# Patient Record
Sex: Male | Born: 1990 | Race: White | Hispanic: No | Marital: Married | Smoking: Never smoker
Health system: Southern US, Community
[De-identification: ages and names within clinical notes are randomized; demographics above are authoritative.]

## PROBLEM LIST (undated history)

## (undated) DIAGNOSIS — I309 Acute pericarditis, unspecified: Secondary | ICD-10-CM

## (undated) DIAGNOSIS — I38 Endocarditis, valve unspecified: Secondary | ICD-10-CM

## (undated) DIAGNOSIS — T7840XA Allergy, unspecified, initial encounter: Secondary | ICD-10-CM

## (undated) DIAGNOSIS — G039 Meningitis, unspecified: Secondary | ICD-10-CM

## (undated) HISTORY — DX: Acute pericarditis, unspecified: I30.9

## (undated) HISTORY — PX: TONSILLECTOMY: SUR1361

## (undated) HISTORY — PX: COLONOSCOPY: SHX174

## (undated) HISTORY — DX: Allergy, unspecified, initial encounter: T78.40XA

---
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
## 2015-05-13 ENCOUNTER — Emergency Department (HOSPITAL_COMMUNITY): Payer: Self-pay

## 2015-05-13 ENCOUNTER — Emergency Department (HOSPITAL_COMMUNITY)
Admission: EM | Admit: 2015-05-13 | Discharge: 2015-05-13 | Disposition: A | Discharge status: Home / Self Care | Payer: Self-pay | Attending: Emergency Medicine | Admitting: Emergency Medicine

## 2015-05-13 ENCOUNTER — Encounter (HOSPITAL_COMMUNITY): Payer: Self-pay | Admitting: Emergency Medicine

## 2015-05-13 DIAGNOSIS — Z8669 Personal history of other diseases of the nervous system and sense organs: Secondary | ICD-10-CM | POA: Insufficient documentation

## 2015-05-13 DIAGNOSIS — R0789 Other chest pain: Secondary | ICD-10-CM | POA: Insufficient documentation

## 2015-05-13 DIAGNOSIS — R079 Chest pain, unspecified: Secondary | ICD-10-CM

## 2015-05-13 HISTORY — DX: Meningitis, unspecified: G03.9

## 2015-05-13 MED ORDER — IBUPROFEN 800 MG PO TABS: 800.0000 mg | ORAL_TABLET | Freq: Three times a day (TID) | ORAL | Status: DC | PRN

## 2015-05-13 MED ORDER — IBUPROFEN 800 MG PO TABS
800.0000 mg | ORAL_TABLET | Freq: Once | ORAL | Status: AC
  Administered 2015-05-13: 800 mg via ORAL
  Filled 2015-05-13: qty 1

## 2015-05-13 NOTE — Discharge Instructions (Signed)

## 2015-05-13 NOTE — ED Provider Notes (Signed)
TIME SEEN: 5:00 AM  CHIEF COMPLAINT: Chest pain  HPI: Pt is a 24 y.o. male with no significant past medical history who presents to the emergency department with left-sided chest pain that he describes as sharp, moderate in nature without aggravating and relieving factors with tingling and his left arm that started just prior to arrival while at work. States he has had intermittent similar symptoms since he was 24 years old. States he has seen a cardiologist before and has worn a Holter monitor and have an echocardiogram. States he was told at one point that he had "inflammation around his heart" without any cause. He denies any shortness of breath. States he did have some dizziness. No nausea, vomiting or diarrhea. No fever or cough. Pain is not exertional or pleuritic. Not associated with food. No lower extremity swelling or pain. Denies any history of PE or DVT, recent prolonged immobilization such as long flight or hospitalization, fracture, surgery, trauma. Does have a family history of a grandfather who had coronary artery disease but no one with premature CAD. No one with sudden cardiac death, HOCM.  Patient denies syncope.  ROS: See HPI Constitutional: no fever  Eyes: no drainage  ENT: no runny nose   Cardiovascular:   chest pain  Resp: no SOB  GI: no vomiting GU: no dysuria Integumentary: no rash  Allergy: no hives  Musculoskeletal: no leg swelling  Neurological: no slurred speech ROS otherwise negative  PAST MEDICAL HISTORY/PAST SURGICAL HISTORY:  Past Medical History  Diagnosis Date  . Meningitis     MEDICATIONS:  Prior to Admission medications   Not on File    ALLERGIES:  Allergies  Allergen Reactions  . Keflex [Cephalexin]     SOCIAL HISTORY:  History  Substance Use Topics  . Smoking status: Never Smoker   . Smokeless tobacco: Not on file  . Alcohol Use: No    FAMILY HISTORY: History reviewed. No pertinent family history.  EXAM: BP 124/89 mmHg  Pulse 101   Temp(Src) 98.2 F (36.8 C)  Resp 18  Ht 5\' 11"  (1.803 m)  Wt 150 lb (68.04 kg)  BMI 20.93 kg/m2  SpO2 98% CONSTITUTIONAL: Alert and oriented and responds appropriately to questions. Well-appearing; well-nourished HEAD: Normocephalic EYES: Conjunctivae clear, PERRL ENT: normal nose; no rhinorrhea; moist mucous membranes; pharynx without lesions noted NECK: Supple, no meningismus, no LAD  CARD: RRR; S1 and S2 appreciated; no murmurs, no clicks, no rubs, no gallops, chest wall nontender to palpation without crepitus or ecchymosis or deformity RESP: Normal chest excursion without splinting or tachypnea; breath sounds clear and equal bilaterally; no wheezes, no rhonchi, no rales, no hypoxia or respiratory distress, speaking full sentences ABD/GI: Normal bowel sounds; non-distended; soft, non-tender, no rebound, no guarding, no peritoneal signs BACK:  The back appears normal and is non-tender to palpation, there is no CVA tenderness EXT: Normal ROM in all joints; non-tender to palpation; no edema; normal capillary refill; no cyanosis, no calf tenderness or swelling    SKIN: Normal color for age and race; warm NEURO: Moves all extremities equally, sensation to light touch intact diffusely, cranial nerves II through XII intact, strength 5/5 in all 4 extremity, normal gait PSYCH: The patient's mood and manner are appropriate. Grooming and personal hygiene are appropriate.  MEDICAL DECISION MAKING: Patient here with atypical chest pain. Heart rate was initially 101 but on my evaluation it is in the 80s. His EKG shows RSR' with no other ischemic changes, arrhythmia or interval change. No old  for comparison. He has no risk factors for ACS. He is PERC negative.  Chest x-ray shows no acute abnormality. We'll discharge patient home with primary care provider follow-up information as well as cardiology follow up information per his request. We'll discharge with anti-inflammatories for pain. I do not feel he  needs any further emergent workup at this time. Discussed return precautions. He verbalizes understanding is comfortable with plan.     EKG Interpretation  Date/Time:  Tuesday May 13 2015 04:55:26 EDT Ventricular Rate:  82 PR Interval:  136 QRS Duration: 99 QT Interval:  348 QTC Calculation: 406 R Axis:   78 Text Interpretation:  Sinus rhythm RSR' in V1 or V2, right VCD or RVH No old tracing to compare Confirmed by Jimmi Sidener,  DO, Odelle Kosier [phone]) on 05/13/2015 4:57:41 AM        Layla Maw Jacolby Risby, DO 05/13/15 6045

## 2015-05-13 NOTE — ED Notes (Addendum)
   05/13/15 0459  Chest Pain Assessment  Occurrence Today  Chronicity New  Chest Pain Location Left chest  Pain Descriptors / Indicators Sharp  Associated Symptoms Dizziness  chest pain that started at work. Pain described as sharp. Pt says had have these pains before has seen cardiology, wore holitor monitor & ultra sound done . Was told had inflammation around his heart. Pt states also having left arm numbness.

## 2015-05-13 NOTE — ED Notes (Signed)
Pt alert & oriented x4, stable gait. Patient given discharge instructions, paperwork & prescription(s). Patient  instructed to stop at the registration desk to finish any additional paperwork. Patient verbalized understanding. Pt left department w/ no further questions. 

## 2015-05-13 NOTE — ED Notes (Signed)
Pt c/o sharp left chest pain and left arm numbness while at work tonight.

## 2016-08-24 ENCOUNTER — Encounter (HOSPITAL_COMMUNITY): Payer: Self-pay | Admitting: *Deleted

## 2016-08-24 ENCOUNTER — Emergency Department (HOSPITAL_COMMUNITY): Payer: Self-pay

## 2016-08-24 ENCOUNTER — Emergency Department (HOSPITAL_COMMUNITY)
Admission: EM | Admit: 2016-08-24 | Discharge: 2016-08-24 | Disposition: A | Discharge status: Home / Self Care | Payer: Self-pay | Attending: Emergency Medicine | Admitting: Emergency Medicine

## 2016-08-24 DIAGNOSIS — Z791 Long term (current) use of non-steroidal anti-inflammatories (NSAID): Secondary | ICD-10-CM | POA: Insufficient documentation

## 2016-08-24 DIAGNOSIS — R0789 Other chest pain: Secondary | ICD-10-CM | POA: Insufficient documentation

## 2016-08-24 HISTORY — DX: Endocarditis, valve unspecified: I38

## 2016-08-24 LAB — BASIC METABOLIC PANEL
Anion gap: 4 — ABNORMAL LOW (ref 5–15)
BUN: 12 mg/dL (ref 6–20)
CALCIUM: 9.3 mg/dL (ref 8.9–10.3)
CHLORIDE: 107 mmol/L (ref 101–111)
CO2: 27 mmol/L (ref 22–32)
Creatinine, Ser: 0.74 mg/dL (ref 0.61–1.24)
GFR calc non Af Amer: >60 mL/min (ref >60)
Glucose, Bld: 97 mg/dL (ref 65–99)
Potassium: 4.4 mmol/L (ref 3.5–5.1)
SODIUM: 138 mmol/L (ref 135–145)

## 2016-08-24 LAB — CBC
HCT: 41.4 % (ref 39.0–52.0)
Hemoglobin: 14.7 g/dL (ref 13.0–17.0)
MCH: 31.5 pg (ref 26.0–34.0)
MCHC: 35.5 g/dL (ref 30.0–36.0)
MCV: 88.7 fL (ref 78.0–100.0)
PLATELETS: 292 10*3/uL (ref 150–400)
RBC: 4.67 MIL/uL (ref 4.22–5.81)
RDW: 12.9 % (ref 11.5–15.5)
WBC: 8.6 10*3/uL (ref 4.0–10.5)

## 2016-08-24 LAB — I-STAT TROPONIN, ED: TROPONIN I, POC: 0 ng/mL (ref 0.00–0.08)

## 2016-08-24 MED ORDER — CYCLOBENZAPRINE HCL 10 MG PO TABS: 10.0000 mg | ORAL_TABLET | Freq: Three times a day (TID) | ORAL | 0 refills | Status: DC

## 2016-08-24 MED ORDER — DEXAMETHASONE 4 MG PO TABS: 4.0000 mg | ORAL_TABLET | Freq: Two times a day (BID) | ORAL | 0 refills | Status: DC

## 2016-08-24 MED ORDER — DIAZEPAM 5 MG PO TABS
10.0000 mg | ORAL_TABLET | Freq: Once | ORAL | Status: AC
  Administered 2016-08-24: 10 mg via ORAL
  Filled 2016-08-24: qty 2

## 2016-08-24 MED ORDER — DICLOFENAC SODIUM 75 MG PO TBEC: 75.0000 mg | DELAYED_RELEASE_TABLET | Freq: Two times a day (BID) | ORAL | 0 refills | Status: DC

## 2016-08-24 MED ORDER — KETOROLAC TROMETHAMINE 30 MG/ML IJ SOLN
30.0000 mg | Freq: Once | INTRAMUSCULAR | Status: AC
  Administered 2016-08-24: 30 mg via INTRAVENOUS
  Filled 2016-08-24: qty 1

## 2016-08-24 NOTE — ED Triage Notes (Addendum)
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 Pt was given 1 Nitro with some BP decrease by EMS, no relief with 1st Nitro and due to BP decrease no further Nitro was given by EMS. Pt has had similar chest pain experiences since age 25 with no reason as to why. Pt has been told to follow up with cardiology several times in the past but is unable to due to not having insurance. Pt denies SOB,  dizziness, lightheadedness, n/v, back pain, arm pain. Pt reports the pain gets worse with deep breaths and any type of movement. When pt was 25 years old he saw a cardiologist and was told he had unknown reason for inflammation around his heart.

## 2016-08-24 NOTE — ED Provider Notes (Signed)
[facility]EMER[facility]ENCY DEPT Provider Note   CSN: [id number] Arrival date & time: 08/24/16  [id number]     History   Chief Complaint Chief Complaint  Patient presents with  . Chest Pain    HPI Sharlyn BolognaJustin E Taitano is a 25 y.o. male.  Patient is a 25 year old male who presents to the emergency department with complaint of chest pain.  The patient states that he was having some sharp pain on last evening. 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 He did not have any loss of consciousness. He did not have any unusual shortness of breath, just the pain. There was no radiation of the pain to the neck or the jaw or down the arm. Patient states he had a similar episode at age 25 and was told he had chest wall pain with inflammation. He says that this pain feels very similar to that. He states that today's pain has been lasting about 2 and half hours. It seems to come and go, but is worse when he changes positions or takes a deep breath. There's been no injury or trauma reported. No surgery to the chest. No excessive cough or fever. No chills, no sweats, no wheezing. The patient denies smoking, alcohol, recreational drugs, and has not been exposed to any new perfumes or chemicals. He's not bone been on any excessively long trips or been immobile on. He has not traveled outside the country. He presents now for assistance with this issue.   The history is provided by the patient.  Chest Pain   Pertinent negatives include no cough, no nausea and no vomiting.    Past Medical History:  Diagnosis Date  . 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 Meningitis     There are no active problems to display for this patient.   Past Surgical History:  Procedure Laterality Date  . TONSILLECTOMY         Home Medications    Prior to Admission medications   Medication Sig Start Date End Date Taking? Authorizing Provider  ibuprofen  (ADVIL,MOTRIN) 200 MG tablet Take 400 mg by mouth every 6 (six) hours as needed.   Yes Historical Provider, MD  ibuprofen (ADVIL,MOTRIN) 800 MG tablet Take 1 tablet (800 mg total) by mouth every 8 (eight) hours as needed for mild pain. Patient not taking: Reported on 08/24/2016 05/13/15   Layla MawKristen N Ward, DO    Family History No family history on file.  Social History Social History  Substance Use Topics  . Smoking status: Never Smoker  . Smokeless tobacco: Never Used  . Alcohol use No     Allergies   Keflex [cephalexin]   Review of Systems Review of Systems  Constitutional: Positive for activity change.  HENT: Negative for congestion.   Respiratory: Negative for cough and wheezing.   Cardiovascular: Positive for chest pain.  Gastrointestinal: Negative for diarrhea, nausea and vomiting.  Musculoskeletal: Negative for neck pain.  Psychiatric/Behavioral: The patient is nervous/anxious.   All other systems reviewed and are negative.    Physical Exam Updated Vital Signs BP 111/85   Pulse 78   Temp 98.1 F (36.7 C) (Oral)   Resp 15   Ht 5\' 11"  (1.803 m)   Wt 65.8 kg   SpO2 97%   BMI 20.22 kg/m   Physical Exam  Constitutional: He is oriented to person,  place, and time. He appears well-developed and well-nourished.  Non-toxic appearance.  HENT:  Head: Normocephalic.  Right Ear: Tympanic membrane and external ear normal.  Left Ear: Tympanic membrane and external ear normal.  Eyes: EOM and lids are normal. Pupils are equal, round, and reactive to light.  Neck: Normal range of motion. Neck supple. Carotid bruit is not present.  Cardiovascular: Normal rate, regular rhythm, normal heart sounds, intact distal pulses and normal pulses.   Pulmonary/Chest: Breath sounds normal. No respiratory distress. He exhibits tenderness. He exhibits no deformity and no retraction.    Abdominal: Soft. Bowel sounds are normal. There is no tenderness. There is no guarding.  Musculoskeletal:  Normal range of motion.  Lymphadenopathy:       Head (right side): No submandibular adenopathy present.       Head (left side): No submandibular adenopathy present.    He has no cervical adenopathy.  Neurological: He is alert and oriented to person, place, and time. He has normal strength. No cranial nerve deficit or sensory deficit.  Skin: Skin is warm and dry.  Psychiatric: He has a normal mood and affect. His speech is normal.  Nursing note and vitals reviewed.    ED Treatments / Results  Labs (all labs ordered are listed, but only abnormal results are displayed) Labs Reviewed  BASIC METABOLIC PANEL - Abnormal; Notable for the following:       Result Value   Anion gap 4 (*)    All other components within normal limits  CBC  I-STAT TROPOININ, ED    EKG  EKG Interpretation None       Radiology Dg Chest 2 View  Result Date: 08/24/2016 CLINICAL DATA:  Left side chest pain EXAM: CHEST  2 VIEW COMPARISON:  07/25/2016 FINDINGS: Cardiomediastinal silhouette is stable. Mild elevation of the left hemidiaphragm again noted. No infiltrate or pulmonary edema. Bony thorax is unremarkable. IMPRESSION: No active cardiopulmonary disease. Electronically Signed   By: Natasha Mead M.D.   On: 08/24/2016 11:31    Procedures Procedures (including critical care time)  Medications Ordered in ED Medications  ketorolac (TORADOL) 30 MG/ML injection 30 mg (30 mg Intravenous Given 08/24/16 1122)  diazepam (VALIUM) tablet 10 mg (10 mg Oral Given 08/24/16 1122)     Initial Impression / Assessment and Plan / ED Course  I have reviewed the triage vital signs and the nursing notes.  Pertinent labs & imaging results that were available during my care of the patient were reviewed by me and considered in my medical decision making (see chart for details).  Clinical Course     *I have reviewed nursing notes, vital signs, and all appropriate lab and imaging results for this patient.**  Final Clinical  Impressions(s) / ED Diagnoses  Vital signs within normal limits. Pulse oximetry is 97% on room air. Electrocardiogram is negative for acute event. Troponin is 0 . basic metabolic panel is negative for acute issue. The complete blood count is well within normal limits. The chest x-ray is also read as negative. PERC neg. The patient has reproduction of the pain with sitting up and with taking a deep breath.  The pain is much improved after intravenous Toradol and oral Valium.  Patient will be treated with diclofenac and Flexeril and Decadron. Patient is to follow with his primary physician or return to the emergency department if any changes or problems.    Final diagnoses:  None    New Prescriptions New Prescriptions   No medications on  file     Ivery QualeHobson Brieanne Mignone, PA-C 08/24/16 1220    Ivery QualeHobson Bettey Muraoka, PA-C 08/24/16 1224    Bethann BerkshireJoseph Zammit, MD 08/24/16 [id number]

## 2016-08-24 NOTE — Discharge Instructions (Signed)
Your heart enzymes and EKG are negative. Your chest x-ray is negative. Your electrolytes are also negative on. Please soak your chest in a tub of warm Epsom salt water for about 15-20 minutes until this pain resolves. Please use Decadron and diclofenac 2 times daily with food. Use Flexeril in the evening and at bedtime.This medication may cause drowsiness. Please do not drink, drive, or participate in activity that requires concentration while taking this medication.

## 2016-09-29 ENCOUNTER — Ambulatory Visit (INDEPENDENT_AMBULATORY_CARE_PROVIDER_SITE_OTHER): Payer: PRIVATE HEALTH INSURANCE | Admitting: Cardiovascular Disease

## 2016-09-29 ENCOUNTER — Encounter: Payer: Self-pay | Admitting: Cardiovascular Disease

## 2016-09-29 VITALS — BP 110/84 | HR 84 | Ht 71.0 in | Wt 184.0 lb

## 2016-09-29 DIAGNOSIS — R079 Chest pain, unspecified: Secondary | ICD-10-CM

## 2016-09-29 DIAGNOSIS — I309 Acute pericarditis, unspecified: Secondary | ICD-10-CM

## 2016-09-29 MED ORDER — COLCHICINE 0.6 MG PO TABS: 0.6000 mg | ORAL_TABLET | Freq: Two times a day (BID) | ORAL | 6 refills | Status: DC

## 2016-09-29 NOTE — Progress Notes (Signed)
CARDIOLOGY CONSULT NOTE  Patient ID: Jeffrey Ortega MRN: [id number] DOB/AGE: 25/03/1991 25 y.o.  Admit date: (Not on file) Primary Physician: No PCP Per Patient Referring Physician: self referral  Reason for Consultation: chest pain  HPI: The patient is a 25 year old male who presents for the evaluation of chronic chest pain. He began experiencing chest pain at the age of [age] and says "no one can tell when it is ". He underwent an echocardiogram at age 25 and said he was told he had "inflammation". He also wore a Holter monitor and nothing was found. He was told to take 9 ibuprofen to reduce inflammation but he still had chest pain. There are no specific aggravating or alleviating factors.  He was seen in the ED on 08/24/16 for chest pain and had a normal ECG, chest x-ray, troponin, and CBC. It can occur when he is playing board games, driving, or sitting down.   It is located in the left precordium and described as sharp and squeezing. He denies chest wall tenderness. He denies any dietary factors which make it worse. The pain can last up to 3 hours at a time. When he was seen in the ED most recently, it was aggravated with deep breathing. He denies tick, spider, and snake bites.  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    Allergies  Allergen Reactions  . Keflex [Cephalexin]     No current outpatient prescriptions on file.   No current facility-administered medications for this visit.     Past Medical History:  Diagnosis Date  . 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 Meningitis     Past Surgical History:  Procedure Laterality Date  . TONSILLECTOMY      Social History   Social History  . Marital status: Single    Spouse name: N/A  . Number of children: N/A  . Years of education: N/A   Occupational History  . Not on file.   Social History Main Topics  . Smoking status: Never Smoker  .  Smokeless tobacco: Never Used  . Alcohol use No  . Drug use: No  . Sexual activity: Not on file   Other Topics Concern  . Not on file   Social History Narrative  . No narrative on file     No family history of premature CAD in 1st degree relatives.  Prior to Admission medications   Not on File     Review of systems complete and found to be negative unless listed above in HPI     Physical exam Blood pressure 110/84, pulse 84, height 5\' 11"  (1.803 m), weight 184 lb (83.5 kg), SpO2 98 %. General: NAD Neck: No JVD, no thyromegaly or thyroid nodule.  Lungs: Clear to auscultation bilaterally with normal respiratory effort. CV: Nondisplaced PMI. Regular rate and rhythm, normal S1/S2, no S3/S4, no murmur.  No peripheral edema.  No carotid bruit.    Abdomen: Soft, nontender, no hepatosplenomegaly, no distention.  Skin: Intact without lesions or rashes.  Neurologic: Alert and oriented x 3.  Psych: Normal affect. Extremities: No clubbing or cyanosis.  HEENT: Normal.   ECG: Most recent ECG reviewed.  Labs:   Lab Results  Component Value Date   WBC 8.6 08/24/2016   HGB 14.7 08/24/2016   HCT 41.4 08/24/2016   MCV 88.7 08/24/2016   PLT 292 08/24/2016  No results for input(s): NA, K, CL, CO2, BUN, CREATININE, CALCIUM, PROT, BILITOT, ALKPHOS, ALT, AST, GLUCOSE in the last 168 hours.  Invalid input(s): LABALBU No results found for: CKTOTAL, CKMB, CKMBINDEX, TROPONINI No results found for: CHOL No results found for: HDL No results found for: LDLCALC No results found for: TRIG No results found for: CHOLHDL No results found for: LDLDIRECT       Studies: No results found.  ASSESSMENT AND PLAN:  1. Chest pain: Symptoms are highly suspicious for pericarditis. I will prescribe colchicine 0.6 mg twice daily for 6 months. I will also obtain a cardiac MRI to evaluate cardiac structure and the pericardium in particular.  Dispo: fu 3 months   Signed: Prentice DockerSuresh Koneswaran,  M.D., F.A.C.C.  09/29/2016, 9:20 AM

## 2016-09-29 NOTE — Patient Instructions (Signed)
Medication Instructions:   Begin Colcrys (Colchicine) 0.6mg  twice a day x 6 months.  Continue all other medications.    Labwork: none  Testing/Procedures:  Your physician has requested that you have a cardiac MRI. Cardiac MRI uses a computer to create images of your heart as its beating, producing both still and moving pictures of your heart and major blood vessels. For further information please visit [email or web address]. Please follow the instruction sheet given to you today for more information.  Office will contact with results via phone or letter.    Follow-Up: 3 months   Any Other Special Instructions Will Be Listed Below (If Applicable).  If you need a refill on your cardiac medications before your next appointment, please call your pharmacy.

## 2016-10-05 ENCOUNTER — Telehealth: Payer: Self-pay | Admitting: Cardiovascular Disease

## 2016-10-05 NOTE — Telephone Encounter (Signed)
Called patient and left a voicemail for him to call me back.  We have his MRI scheduled for 10-08-16 aat 8:30 a.m.

## 2016-10-07 ENCOUNTER — Other Ambulatory Visit: Payer: Self-pay | Admitting: *Deleted

## 2016-10-07 DIAGNOSIS — Z01812 Encounter for preprocedural laboratory examination: Secondary | ICD-10-CM

## 2016-10-07 DIAGNOSIS — R079 Chest pain, unspecified: Secondary | ICD-10-CM

## 2016-10-08 ENCOUNTER — Ambulatory Visit (HOSPITAL_COMMUNITY)
Admission: RE | Admit: 2016-10-08 | Discharge: 2016-10-08 | Disposition: A | Discharge status: Home / Self Care | Payer: 59 | Source: Ambulatory Visit | Attending: Cardiovascular Disease | Admitting: Cardiovascular Disease

## 2016-10-08 DIAGNOSIS — I340 Nonrheumatic mitral (valve) insufficiency: Secondary | ICD-10-CM | POA: Diagnosis not present

## 2016-10-08 DIAGNOSIS — R079 Chest pain, unspecified: Secondary | ICD-10-CM

## 2016-10-08 DIAGNOSIS — I309 Acute pericarditis, unspecified: Secondary | ICD-10-CM

## 2016-10-08 LAB — CREATININE, SERUM
CREATININE: 0.92 mg/dL (ref 0.61–1.24)
GFR calc Af Amer: >60 mL/min (ref >60)
GFR calc non Af Amer: >60 mL/min (ref >60)

## 2016-10-08 MED ORDER — GADOBENATE DIMEGLUMINE 529 MG/ML IV SOLN: 27.0000 mL | Freq: Once | INTRAVENOUS | Status: DC | PRN

## 2016-10-12 ENCOUNTER — Telehealth: Payer: Self-pay | Admitting: Cardiology

## 2016-10-12 NOTE — Telephone Encounter (Signed)
Mr. Jeffrey Ortega called requesting test results of his recent MRI.  States that he is reading results on My Chart and he thinks it is serious.

## 2016-10-12 NOTE — Telephone Encounter (Signed)
Notes Recorded by Lesle ChrisAngela G Benford Asch, LPN on 40/98/119112/26/2017 at 5:58 PM EST Patient notified.  No PMD listed.   Notes Recorded by Antoine PocheJonathan F Branch, MD on 10/12/2016 at 3:23 PM EST Heart MR did show some evidence of pericarditis, the medicine Dr Kirtland BouchardK started him on should help over time. How are his symptoms doing?

## 2016-10-12 NOTE — Telephone Encounter (Signed)
Patient also had multiple questions about the results & question if he could see the MD sooner than 3 months.  Also stated that he has not been able to get the Colchicine due to cost.    Reviewed test results with him, but was concerned about the mitral regurgitation as he read this in My Chart.   His insurance [employer]) did not cover the generic Colchicine, but did prefer the brand (Colcrys).  Even with his insurance, co-pay per month was going to be $329.68.    Is there any other medication options.  Not sure if any assistance is available for this particular medication.

## 2016-10-13 NOTE — Telephone Encounter (Signed)
After further research, there is a co-pay assistance card that patient may be eligible to use.  Patient can download a savings card at [email or web address].  Left message on patient's voicemail & mailed this info.

## 2016-10-13 NOTE — Telephone Encounter (Signed)
Patient notified

## 2016-10-13 NOTE — Telephone Encounter (Signed)
Test showed only mild mitral regurgitation, this is a very common finding and not something I would be worried about. If unable to afford colchicine does not have to start. If he is still having chest pain I would start ibuprofen 800mg  tid for 2 weeks and then call us back to update us on symptoms, he can f/u with Dr Kirtland BouchardK in 1 month. Needs to stay very well hydrated with taking ibuprofen. If no current chest pain he does not actually require medications. Pericarditis is most commonly causes by viruses that self resolve, they can leave some lingering inflammation which is primarily what the medications treat.     Dominga FerryJ Ezana Hubbert MD

## 2016-10-15 ENCOUNTER — Other Ambulatory Visit (HOSPITAL_COMMUNITY): Payer: PRIVATE HEALTH INSURANCE

## 2016-11-08 ENCOUNTER — Ambulatory Visit: Payer: Self-pay | Admitting: Cardiovascular Disease

## 2016-11-09 ENCOUNTER — Encounter: Payer: Self-pay | Admitting: Cardiovascular Disease

## 2017-01-05 ENCOUNTER — Ambulatory Visit: Payer: Self-pay | Admitting: Cardiovascular Disease

## 2017-01-12 ENCOUNTER — Telehealth: Payer: Self-pay | Admitting: Cardiovascular Disease

## 2017-01-12 NOTE — Telephone Encounter (Signed)
PATIENT DOES NOT WANT US TO CALL HIM WITH REMINDER DAY BEFORE [facility]

## 2017-01-13 ENCOUNTER — Ambulatory Visit (INDEPENDENT_AMBULATORY_CARE_PROVIDER_SITE_OTHER): Payer: PRIVATE HEALTH INSURANCE | Admitting: Cardiovascular Disease

## 2017-01-13 ENCOUNTER — Encounter: Payer: Self-pay | Admitting: Cardiovascular Disease

## 2017-01-13 VITALS — BP 118/80 | HR 91 | Ht 71.0 in | Wt 199.0 lb

## 2017-01-13 DIAGNOSIS — R079 Chest pain, unspecified: Secondary | ICD-10-CM | POA: Diagnosis not present

## 2017-01-13 DIAGNOSIS — I309 Acute pericarditis, unspecified: Secondary | ICD-10-CM | POA: Diagnosis not present

## 2017-01-13 MED ORDER — PREDNISONE 5 MG PO TABS: ORAL_TABLET | ORAL | 3 refills | Status: DC

## 2017-01-13 NOTE — Progress Notes (Signed)
      SUBJECTIVE: The patient presents for follow-up of pericarditis. Cardiac MRI on 10/09/16 demonstrated normal left ventricular systolic function, LVEF 60%, mild mitral regurgitation, and mild localized thickening anteriorly of the pericardium associated mild inflammation and consistent with a mild focal acute pericarditis. There was no pericardial effusion.  He continues to experience chest pain sporadically. He may experience it every day for a week and then he may go one month without any chest pain. He has tried taking ibuprofen up to 400 mg 3-4 times daily with no relief. Colchicine would cost him over $400.   Review of Systems: As per "subjective", otherwise negative.  Allergies  Allergen Reactions  . Keflex [Cephalexin]     No current outpatient prescriptions on file.   No current facility-administered medications for this visit.     Past Medical History:  Diagnosis Date  . 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 Meningitis     Past Surgical History:  Procedure Laterality Date  . TONSILLECTOMY      Social History   Social History  . Marital status: Single    Spouse name: N/A  . Number of children: N/A  . Years of education: N/A   Occupational History  . Not on file.   Social History Main Topics  . Smoking status: Never Smoker  . Smokeless tobacco: Never Used  . Alcohol use No  . Drug use: No  . Sexual activity: Not on file   Other Topics Concern  . Not on file   Social History Narrative  . No narrative on file     Vitals:   01/13/17 1523  BP: 118/80  Pulse: 91  SpO2: 96%  Weight: 199 lb (90.3 kg)  Height: 5\' 11"  (1.803 m)    PHYSICAL EXAM General: NAD HEENT: Normal. Neck: No JVD, no thyromegaly. Lungs: Clear to auscultation bilaterally with normal respiratory effort. CV: Nondisplaced PMI.  Regular rate and rhythm, normal S1/S2, no S3/S4, no murmur or rubs. No pretibial or periankle edema.  No carotid bruit.   Abdomen:  Soft, nontender, no distention.  Neurologic: Alert and oriented.  Psych: Normal affect. Skin: Normal. Musculoskeletal: No gross deformities.    ECG: Most recent ECG reviewed.      ASSESSMENT AND PLAN: 1. Pericarditis: Colchicine was too expensive for him and ibuprofen did not alleviate his symptoms. I will prescribe prednisone 5 mg to be taken for one week during periodic flares of pericarditis when chest pain does not go away for 2-3 days.  Dispo: fu 1 year.  Time spent: 25 minutes.  Prentice DockerSuresh Roseanna Koplin, M.D., F.A.C.C.

## 2017-01-13 NOTE — Patient Instructions (Signed)
Medication Instructions:  Prednisone 5mg  daily x 7 days for flare up lasting greater than 2-3 days, then only as needed.  Labwork: none  Testing/Procedures: none  Follow-Up: Your physician wants you to follow up in:  1 year.  You will receive a reminder letter in the mail one-two months in advance.  If you don't receive a letter, please call our office to schedule the follow up appointment   Any Other Special Instructions Will Be Listed Below (If Applicable).  If you need a refill on your cardiac medications before your next appointment, please call your pharmacy.

## 2017-04-26 ENCOUNTER — Ambulatory Visit (INDEPENDENT_AMBULATORY_CARE_PROVIDER_SITE_OTHER): Payer: 59 | Admitting: Physician Assistant

## 2017-04-26 ENCOUNTER — Encounter: Payer: Self-pay | Admitting: Physician Assistant

## 2017-04-26 VITALS — BP 106/67 | HR 80 | Temp 98.4°F | Ht 71.0 in | Wt 198.4 lb

## 2017-04-26 DIAGNOSIS — I319 Disease of pericardium, unspecified: Secondary | ICD-10-CM | POA: Insufficient documentation

## 2017-04-26 NOTE — Progress Notes (Signed)
BP 106/67   Pulse 80   Temp 98.4 F (36.9 C) (Oral)   Ht 5\' 11"  (1.803 m)   Wt 198 lb 6.4 oz (90 kg)   BMI 27.67 kg/m    Subjective:    Patient ID: Jeffrey Ortega, male    DOB: 03/23/1991, 26 y.o.   MRN: [id number]  HPI: Jeffrey Ortega is a 26 y.o. male presenting on 04/26/2017 for New Patient (Initial Visit) (Establish Care )  This patient comes in as a new patient to be established. He had lived in [place] for some time but is moving back to [place] in this past year. Overall is fairly healthy. He did have a final diagnosis made for his pericarditis by Dr. Darl HouseholderKoneswaren. They have diagnosed it as acute pericarditis. It given him a prescription for Colcrys. He cannot afford it due to a very heavy price tag. His insurance is not pain for. I am unsure if a prior off could be performed due to the diagnosis. This is a typical treatment for pericarditis. Otherwise he is doing well. He is a newborn's father. His wife had her baby 3 days ago.  Relevant past medical, surgical, family and social history reviewed and updated as indicated. Allergies and medications reviewed and updated.  Past Medical History:  Diagnosis Date  . Allergy   . 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 Meningitis   . Pericarditis, acute     Past Surgical History:  Procedure Laterality Date  . TONSILLECTOMY      Review of Systems  Constitutional: Negative.  Negative for appetite change and fatigue.  HENT: Negative.   Eyes: Negative.  Negative for pain and visual disturbance.  Respiratory: Negative.  Negative for cough, chest tightness, shortness of breath and wheezing.   Cardiovascular: Positive for chest pain. Negative for palpitations and leg swelling.  Gastrointestinal: Negative.  Negative for abdominal pain, diarrhea, nausea and vomiting.  Endocrine: Negative.   Genitourinary: Negative.   Musculoskeletal: Negative.  Negative for arthralgias.  Skin: Negative.  Negative for color  change and rash.  Neurological: Negative.  Negative for weakness, numbness and headaches.  Psychiatric/Behavioral: Negative.     Allergies as of 04/26/2017      Reactions   Keflex [cephalexin]       Medication List    as of 04/26/2017  4:54 PM   You have not been prescribed any medications.        Objective:    BP 106/67   Pulse 80   Temp 98.4 F (36.9 C) (Oral)   Ht 5\' 11"  (1.803 m)   Wt 198 lb 6.4 oz (90 kg)   BMI 27.67 kg/m   Allergies  Allergen Reactions  . Keflex [Cephalexin]     Physical Exam  Constitutional: He appears well-developed and well-nourished.  HENT:  Head: Normocephalic and atraumatic.  Eyes: Conjunctivae and EOM are normal. Pupils are equal, round, and reactive to light.  Neck: Normal range of motion. Neck supple.  Cardiovascular: Normal rate, regular rhythm and normal heart sounds.   Pulmonary/Chest: Effort normal and breath sounds normal.  Abdominal: Soft. Bowel sounds are normal.  Musculoskeletal: Normal range of motion.  Skin: Skin is warm and dry.  Nursing note and vitals reviewed.   Results for orders placed or performed during the hospital encounter of 10/08/16  Creatinine, serum  Result Value Ref Range   Creatinine, Ser 0.92 0.61 - 1.24 mg/dL   GFR  calc non Af Amer >60 >60 mL/min   GFR calc Af Amer >60 >60 mL/min      Assessment & Plan:   1. Chronic idiopathic pericarditis, unspecified complication status Colchicine prescribed by cardiologist. Patient unable to afford due to the $3-$400 price tag. I've encouraged him to discontinue any caffeine. I'm can have him try ibuprofen 400 mg twice a day chronically and we will recheck him in 6 months. He can call if anything flares up again. He is not due to see the cardiologist again till next January   Continue all other maintenance medications as listed above.  Follow up plan: Return in about 6 months (around 10/27/2017) for rechekc.  Educational handout given for  pericarditis  Remus Loffler PA-C [facility] [address]  [place], [place] [address] [phone]   04/26/2017, 4:54 PM

## 2017-04-26 NOTE — Patient Instructions (Signed)
Pericarditis Pericarditis is swelling and irritation (inflammation) of your pericardium. The pericardium is a thin, double-layered, fluid-filled sac that surrounds your heart. The pericardium protects and holds your heart in your chest cavity. Inflammation of your pericardium can cause rubbing (friction) between the two layers when your heart beats. Fluid may build up between the layers of the sac (pericardial effusion). Different types of pericarditis include:  Acute pericarditis. Inflammation develops suddenly and causes pericardial effusion.  Chronic pericarditis. Inflammation may develop gradually, or it may continue after acute pericarditis and last longer than 6 months.  Constrictive pericarditis. The layers of the pericardium stiffen and develop scar tissue. The scar tissue thickens and sticks together. This makes it difficult for the heart to pump and to work as it normally does. This type is rare.  In most cases, pericarditis is acute and not serious. Chronic pericarditis and constrictive pericarditis may be more serious and may require treatment. What are the causes? Often, the cause of pericarditis is not known.If a cause is found, the cause may be:  A viral infection.  A heart attack (myocardial infarction).  Open-heart surgery (coronary artery bypass graft surgery).  Chest injury.  Autoimmune conditions, such as lupus or rheumatoid arthritis.  Kidney failure.  Low-functioning thyroid gland (hypothyroidism).  Cancer from another part of the body that has spread (metastasized) to the pericardium.  Radiation treatment.  Certain medicines, including some seizure medicines, blood thinners, heart medicines, and antibiotics.  A bacterial or fungal infection. This cause is less common.  What increases the risk? The following factors may increase your risk of pericarditis:  Being male.  Being 20-50 years old.  Having had pericarditis before.  Having had a recent  upper respiratory tract infection.  What are the signs or symptoms? The most common symptom of pericarditis is chest pain. This pain may:  Be in the center of your chest or the left side of your chest.  Not go away with rest.  Last for many hours or days.  Worsen when you lie down and go away when you sit up and lean forward.  Worsen when you swallow.  Move to your back, neck, or shoulder.  Other symptoms may include:  A chronic, dry cough.  Heart palpitations. These may feel like rapid, fluttering, or pounding heartbeats.  Dizziness or fainting.  Tiredness or fatigue.  Fever.  Rapid breathing.  Shortness of breath when lying down.  How is this diagnosed? This condition is diagnosed with a medical history, physical exam, and diagnostic tests. During your physical exam, your health care provider will listen for friction while your heart beats (pericardial rub). You may also have tests, including:  Blood work to look for signs of infection and inflammation.  Electrocardiogram (ECG).  Echocardiogram.  CT scan.  MRI.  Culture of pericardial fluid.  A tissue sample (biopsy) of the pericardium.  If tests show that you may have constrictive pericarditis, you may have a procedure (cardiac catheterization) to confirm this diagnosis. How is this treated? Treatment for this condition depends on the cause and type of pericarditis. In most cases, acute pericarditis will clear up on its own within 10 days. Treatment for other types of pericarditis may include:  Medicines, such as: ? NSAIDs for pain and inflammation. ? Steroids to reduce inflammation. ? Colchicine to relieve pain and inflammation.  A procedure to remove fluid using a needle (pericardiocentesis) if pericardial effusion puts pressure on the heart.  Surgery to remove part of the pericardium if constrictive pericarditis   develops.  If another condition is causing your pericarditis, you may need treatment  for that underlying condition. Follow these instructions at home:  Do not use tobacco products, including cigarettes, chewing tobacco, or e-cigarettes. If you need help quitting, ask your health care provider.  Maintain a healthy weight.  Follow an exercise program as told by your health care provider. You may need to limit your exercise until your symptoms go away.  Eat a heart-healthy diet. A registered dietitian can help you to learn about healthy food choices.  Take over-the-counter and prescription medicines only as told by your health care provider. Keep a list of all of your medicines with you at all times. For each medicine, include information about the name, the dosage, how often you take it, and how you take it.  Keep all follow-up visits as told by your health care provider. This is important. Contact a health care provider if:  You continue to have symptoms of pericarditis.  You develop new symptoms of pericarditis.  Your symptoms get worse. Get help right away if:  You have worsening chest pain and difficulty breathing. These symptoms may represent a serious problem that is an emergency. Do not wait to see if the symptoms will go away. Get medical help right away. Call your local emergency services (911 in the [place] Do not drive yourself to the hospital. This information is not intended to replace advice given to you by your health care provider. Make sure you discuss any questions you have with your health care provider. Document Released: 03/30/2001 Document Revised: 03/08/2016 Document Reviewed: 04/16/2015 Elsevier Interactive Patient Education  2018 Elsevier Inc.  

## 2017-05-23 ENCOUNTER — Ambulatory Visit (INDEPENDENT_AMBULATORY_CARE_PROVIDER_SITE_OTHER): Payer: 59

## 2017-05-23 ENCOUNTER — Telehealth: Payer: Self-pay | Admitting: Cardiovascular Disease

## 2017-05-23 ENCOUNTER — Ambulatory Visit (INDEPENDENT_AMBULATORY_CARE_PROVIDER_SITE_OTHER): Payer: 59 | Admitting: Family

## 2017-05-23 ENCOUNTER — Encounter: Payer: Self-pay | Admitting: Family

## 2017-05-23 VITALS — BP 117/82 | HR 106 | Temp 98.9°F | Ht 71.0 in | Wt 198.0 lb

## 2017-05-23 DIAGNOSIS — R0789 Other chest pain: Secondary | ICD-10-CM | POA: Diagnosis not present

## 2017-05-23 DIAGNOSIS — K219 Gastro-esophageal reflux disease without esophagitis: Secondary | ICD-10-CM

## 2017-05-23 DIAGNOSIS — I4510 Unspecified right bundle-branch block: Secondary | ICD-10-CM | POA: Diagnosis not present

## 2017-05-23 MED ORDER — OMEPRAZOLE 20 MG PO CPDR: 20.0000 mg | DELAYED_RELEASE_CAPSULE | Freq: Every day | ORAL | 3 refills | Status: DC

## 2017-05-23 NOTE — Telephone Encounter (Signed)
Returned call to patient.  Complains of active chest pain 5/10 currently.  States that this feels different than the pericarditis.  No SOB.  Does feel light headed at times.  Advised patient that he should be evaluated in the ED, but patient stated that he has been a lot of times & they never find anything wrong.  Stated that he was told today that he has RBBB.  Nurse unable to acces EKG in EP[facility].  Will call office in the morning to have manually faxed to office for MD review.  In the meantime, strongly suggested being evaluated in the ED for worsening symptoms.

## 2017-05-23 NOTE — Telephone Encounter (Signed)
[facility] in reference to an EKG he had done today in there office.  He was told that it showed he has developed Right bundle branch block. He also stated he has chest discomfort. Could not describe but said it is not painful unless he lays down

## 2017-05-23 NOTE — Progress Notes (Signed)
   Subjective:    Patient ID: Jeffrey BolognaJustin E Bennis, male    DOB: 11/19/1990, 26 y.o.   MRN: [id number]  Chest Pain   This is a new problem. The current episode started yesterday. The problem occurs constantly. The problem has been unchanged. The pain is at a severity of 10/10. The pain is moderate. The quality of the pain is described as heavy and tightness. The pain does not radiate. Pertinent negatives include no back pain, cough, dizziness, exertional chest pressure, fever, near-syncope, shortness of breath, sputum production, vomiting or weakness. Associated symptoms comments: Light headed . Associated with: laying on back or side. He has tried nothing for the symptoms. The treatment provided no relief.      Review of Systems  Constitutional: Negative for fever.  Respiratory: Negative for cough, sputum production and shortness of breath.   Cardiovascular: Positive for chest pain. Negative for near-syncope.  Gastrointestinal: Negative for vomiting.  Musculoskeletal: Negative for back pain.  Neurological: Negative for dizziness and weakness.  All other systems reviewed and are negative.      Objective:   Physical Exam  Constitutional: He is oriented to person, place, and time. He appears well-developed and well-nourished. No distress.  HENT:  Head: Normocephalic.  Right Ear: External ear normal.  Left Ear: External ear normal.  Mouth/Throat: Oropharynx is clear and moist.  Eyes: Pupils are equal, round, and reactive to light. Right eye exhibits no discharge. Left eye exhibits no discharge.  Neck: Normal range of motion. Neck supple. No thyromegaly present.  Cardiovascular: Normal rate, regular rhythm, normal heart sounds and intact distal pulses.   No murmur heard. Pulmonary/Chest: Effort normal and breath sounds normal. Tachypnea noted. No respiratory distress. He has no wheezes.  Abdominal: Soft. Bowel sounds are normal. He exhibits no distension. There is no tenderness.    Musculoskeletal: Normal range of motion. He exhibits no edema or tenderness.  Neurological: He is alert and oriented to person, place, and time. He has normal reflexes. No cranial nerve deficit.  Skin: Skin is warm and dry. No rash noted. No erythema.  Psychiatric: He has a normal mood and affect. His behavior is normal. Judgment and thought content normal.  Vitals reviewed.    BP 117/82   Pulse (!) 106   Temp 98.9 F (37.2 C) (Oral)   Ht 5\' 11"  (1.803 m)   Wt 198 lb (89.8 kg)   BMI 27.62 kg/m      Assessment & Plan:  1. Chest discomfort - EKG 12-Lead - DG Chest 2 View; Future   2. Chest tightness or pressure - DG Chest 2 View; Future  3. Gastroesophageal reflux disease, esophagitis presence not specified Pt started on Prilosec today -Diet discussed- Avoid fried, spicy, citrus foods, caffeine and alcohol -Do not eat 2-3 hours before bedtime -Encouraged small frequent meals -Avoid NSAID's - omeprazole (PRILOSEC) 20 MG capsule; Take 1 capsule (20 mg total) by mouth daily.  Dispense: 30 capsule; Refill: 3   4. Incomplete right bundle branch block  If chest worsens or he develops SOB, palpitations, or other changes go to ED Pt to make appt  Cardiologists- Pt has chronic idiopathic pericarditis  I will treat this as GERD   Jannifer Rodneyhristy Michaeleen Down, FNP

## 2017-05-23 NOTE — Patient Instructions (Signed)
Nonspecific Chest Pain °Chest pain can be caused by many different conditions. There is always a chance that your pain could be related to something serious, such as a heart attack or a blood clot in your lungs. Chest pain can also be caused by conditions that are not life-threatening. If you have chest pain, it is very important to follow up with your health care provider. °What are the causes? °Causes of this condition include: °· Heartburn. °· Pneumonia or bronchitis. °· Anxiety or stress. °· Inflammation around your heart (pericarditis) or lung (pleuritis or pleurisy). °· A blood clot in your lung. °· A collapsed lung (pneumothorax). This can develop suddenly on its own (spontaneous pneumothorax) or from trauma to the chest. °· Shingles infection (varicella-zoster virus). °· Heart attack. °· Damage to the bones, muscles, and cartilage that make up your chest wall. This can include: °? Bruised bones due to injury. °? Strained muscles or cartilage due to frequent or repeated coughing or overwork. °? Fracture to one or more ribs. °? Sore cartilage due to inflammation (costochondritis). ° °What increases the risk? °Risk factors for this condition may include: °· Activities that increase your risk for trauma or injury to your chest. °· Respiratory infections or conditions that cause frequent coughing. °· Medical conditions or overeating that can cause heartburn. °· Heart disease or family history of heart disease. °· Conditions or health behaviors that increase your risk of developing a blood clot. °· Having had chicken pox (varicella zoster). ° °What are the signs or symptoms? °Chest pain can feel like: °· Burning or tingling on the surface of your chest or deep in your chest. °· Crushing, pressure, aching, or squeezing pain. °· Dull or sharp pain that is worse when you move, cough, or take a deep breath. °· Pain that is also felt in your back, neck, shoulder, or arm, or pain that spreads to any of these  areas. ° °Your chest pain may come and go, or it may stay constant. °How is this diagnosed? °Lab tests or other studies may be needed to find the cause of your pain. Your health care provider may have you take a test called an ECG (electrocardiogram). An ECG records your heartbeat patterns at the time the test is performed. You may also have other tests, such as: °· Transthoracic echocardiogram (TTE). In this test, sound waves are used to create a picture of the heart structures and to look at how blood flows through your heart. °· Transesophageal echocardiogram (TEE). This is a more advanced imaging test that takes images from inside your body. It allows your health care provider to see your heart in finer detail. °· Cardiac monitoring. This allows your health care provider to monitor your heart rate and rhythm in real time. °· Holter monitor. This is a portable device that records your heartbeat and can help to diagnose abnormal heartbeats. It allows your health care provider to track your heart activity for several days, if needed. °· Stress tests. These can be done through exercise or by taking medicine that makes your heart beat more quickly. °· Blood tests. °· Other imaging tests. ° °How is this treated? °Treatment depends on what is causing your chest pain. Treatment may include: °· Medicines. These may include: °? Acid blockers for heartburn. °? Anti-inflammatory medicine. °? Pain medicine for inflammatory conditions. °? Antibiotic medicine, if an infection is present. °? Medicines to dissolve blood clots. °? Medicines to treat coronary artery disease (CAD). °· Supportive care for conditions that   do not require medicines. This may include: °? Resting. °? Applying heat or cold packs to injured areas. °? Limiting activities until pain decreases. ° °Follow these instructions at home: °Medicines °· If you were prescribed an antibiotic, take it as told by your health care provider. Do not stop taking the  antibiotic even if you start to feel better. °· Take over-the-counter and prescription medicines only as told by your health care provider. °Lifestyle °· Do not use any products that contain nicotine or tobacco, such as cigarettes and e-cigarettes. If you need help quitting, ask your health care provider. °· Do not drink alcohol. °· Make lifestyle changes as directed by your health care provider. These may include: °? Getting regular exercise. Ask your health care provider to suggest some activities that are safe for you. °? Eating a heart-healthy diet. A registered dietitian can help you to learn healthy eating options. °? Maintaining a healthy weight. °? Managing diabetes, if necessary. °? Reducing stress, such as with yoga or relaxation techniques. °General instructions °· Avoid any activities that bring on chest pain. °· If heartburn is the cause for your chest pain, raise (elevate) the head of your bed about 6 inches (15 cm) by putting blocks under the legs. Sleeping with more pillows does not effectively relieve heartburn because it only changes the position of your head. °· Keep all follow-up visits as told by your health care provider. This is important. This includes any further testing if your chest pain does not go away. °Contact a health care provider if: °· Your chest pain does not go away. °· You have a rash with blisters on your chest. °· You have a fever. °· You have chills. °Get help right away if: °· Your chest pain is worse. °· You have a cough that gets worse, or you cough up blood. °· You have severe pain in your abdomen. °· You have severe weakness. °· You faint. °· You have sudden, unexplained chest discomfort. °· You have sudden, unexplained discomfort in your arms, back, neck, or jaw. °· You have shortness of breath at any time. °· You suddenly start to sweat, or your skin gets clammy. °· You feel nauseous or you vomit. °· You suddenly feel light-headed or dizzy. °· Your heart begins to beat  quickly, or it feels like it is skipping beats. °These symptoms may represent a serious problem that is an emergency. Do not wait to see if the symptoms will go away. Get medical help right away. Call your local emergency services (911 in the [place] Do not drive yourself to the hospital. °This information is not intended to replace advice given to you by your health care provider. Make sure you discuss any questions you have with your health care provider. °Document Released: 07/14/2005 Document Revised: 06/28/2016 Document Reviewed: 06/28/2016 °Elsevier Interactive Patient Education © 2017 Elsevier Inc. ° °

## 2017-05-24 NOTE — Telephone Encounter (Signed)
Patient notified.  Stated that the pain has actually totally gone.  Went away last night.  Advised to call back if have any other concerns.

## 2017-05-24 NOTE — Telephone Encounter (Signed)
ECG shows sinus rhythm with incomplete RBBB, and there are no significant changes from most recent ECG in 08/2016 as he nearly had an incomplete RBBB at that time. This does not relate to his chest pain. I agree he should be evaluated in ED if pain appears different from his usual pericarditis pain.

## 2017-05-24 NOTE — Telephone Encounter (Signed)
Message fwd to provider for review as EKG is now viewable in EPIC.

## 2017-08-16 ENCOUNTER — Encounter: Payer: Self-pay | Admitting: Family

## 2017-08-16 ENCOUNTER — Ambulatory Visit (INDEPENDENT_AMBULATORY_CARE_PROVIDER_SITE_OTHER): Payer: 59 | Admitting: Family

## 2017-08-16 VITALS — BP 124/85 | HR 108 | Temp 97.9°F | Ht 71.0 in | Wt 207.6 lb

## 2017-08-16 DIAGNOSIS — E663 Overweight: Secondary | ICD-10-CM

## 2017-08-16 DIAGNOSIS — R197 Diarrhea, unspecified: Secondary | ICD-10-CM

## 2017-08-16 DIAGNOSIS — K580 Irritable bowel syndrome with diarrhea: Secondary | ICD-10-CM | POA: Diagnosis not present

## 2017-08-16 NOTE — Progress Notes (Signed)
   Subjective:    Patient ID: Jeffrey BolognaJustin E Nicastro, male    DOB: 09/07/1991, 26 y.o.   MRN: [id number]  Diarrhea   This is a new problem. The current episode started more than 1 month ago. The problem occurs 2 to 4 times per day. The problem has been unchanged. The stool consistency is described as watery. The patient states that diarrhea awakens him from sleep. Associated symptoms include bloating and increased flatus. Pertinent negatives include no abdominal pain, myalgias or vomiting. Nothing aggravates the symptoms. There are no known risk factors. He has tried nothing for the symptoms. The treatment provided no relief.      Review of Systems  Gastrointestinal: Positive for bloating, diarrhea and flatus. Negative for abdominal pain and vomiting.  Musculoskeletal: Negative for myalgias.  All other systems reviewed and are negative.      Objective:   Physical Exam  Constitutional: He is oriented to person, place, and time. He appears well-developed and well-nourished. No distress.  HENT:  Head: Normocephalic.  Right Ear: External ear normal.  Left Ear: External ear normal.  Nose: Nose normal.  Mouth/Throat: Oropharynx is clear and moist.  Eyes: Pupils are equal, round, and reactive to light. Right eye exhibits no discharge. Left eye exhibits no discharge.  Neck: Normal range of motion. Neck supple. No thyromegaly present.  Cardiovascular: Normal rate, regular rhythm, normal heart sounds and intact distal pulses.   No murmur heard. Pulmonary/Chest: Effort normal and breath sounds normal. No respiratory distress. He has no wheezes.  Abdominal: Soft. He exhibits no distension. There is no tenderness.  Hyperactive   Musculoskeletal: Normal range of motion. He exhibits no edema or tenderness.  Neurological: He is alert and oriented to person, place, and time.  Skin: Skin is warm and dry. No rash noted. No erythema.  Psychiatric: He has a normal mood and affect. His behavior is normal.  Judgment and thought content normal.  Vitals reviewed.     BP 124/85   Pulse (!) 108   Temp 97.9 F (36.6 C) (Oral)   Ht 5\' 11"  (1.803 m)   Wt 207 lb 9.6 oz (94.2 kg)   BMI 28.95 kg/m      Assessment & Plan:  1. Diarrhea, unspecified type  2. Irritable bowel syndrome with diarrhea  3. Overweight (BMI 25.0-29.9)  Discussed starting daily fiber and probiotic  Food journal  Avoid diary, fatty foods, and alcohol Pt does not want blood work today, if symptoms worsen will do lab work at that time RTO in 2 months    Jannifer Rodneyhristy Lucella Pommier, FNP

## 2017-08-16 NOTE — Patient Instructions (Signed)
Irritable Bowel Syndrome, Adult Irritable bowel syndrome (IBS) is not one specific disease. It is a group of symptoms that affects the organs responsible for digestion (gastrointestinal or GI tract). To regulate how your GI tract works, your body sends signals back and forth between your intestines and your brain. If you have IBS, there may be a problem with these signals. As a result, your GI tract does not function normally. Your intestines may become more sensitive and overreact to certain things. This is especially true when you eat certain foods or when you are under stress. There are four types of IBS. These may be determined based on the consistency of your stool:  IBS with diarrhea.  IBS with constipation.  Mixed IBS.  Unsubtyped IBS.  It is important to know which type of IBS you have. Some treatments are more likely to be helpful for certain types of IBS. What are the causes? The exact cause of IBS is not known. What increases the risk? You may have a higher risk of IBS if:  You are a woman.  You are younger than 26 years old.  You have a family history of IBS.  You have mental health problems.  You have had bacterial infection of your GI tract.  What are the signs or symptoms? Symptoms of IBS vary from person to person. The main symptom is abdominal pain or discomfort. Additional symptoms usually include one or more of the following:  Diarrhea, constipation, or both.  Abdominal swelling or bloating.  Feeling full or sick after eating a small or regular-size meal.  Frequent gas.  Mucus in the stool.  A feeling of having more stool left after a bowel movement.  Symptoms tend to come and go. They may be associated with stress, psychiatric conditions, or nothing at all. How is this diagnosed? There is no specific test to diagnose IBS. Your health care provider will make a diagnosis based on a physical exam, medical history, and your symptoms. You may have other  tests to rule out other conditions that may be causing your symptoms. These may include:  Blood tests.  X-rays.  CT scan.  Endoscopy and colonoscopy. This is a test in which your GI tract is viewed with a long, thin, flexible tube.  How is this treated? There is no cure for IBS, but treatment can help relieve symptoms. IBS treatment often includes:  Changes to your diet, such as: ? Eating more fiber. ? Avoiding foods that cause symptoms. ? Drinking more water. ? Eating regular, medium-sized portioned meals.  Medicines. These may include: ? Fiber supplements if you have constipation. ? Medicine to control diarrhea (antidiarrheal medicines). ? Medicine to help control muscle spasms in your GI tract (antispasmodic medicines). ? Medicines to help with any mental health issues, such as antidepressants or tranquilizers.  Therapy. ? Talk therapy may help with anxiety, depression, or other mental health issues that can make IBS symptoms worse.  Stress reduction. ? Managing your stress can help keep symptoms under control.  Follow these instructions at home:  Take medicines only as directed by your health care provider.  Eat a healthy diet. ? Avoid foods and drinks with added sugar. ? Include more whole grains, fruits, and vegetables gradually into your diet. This may be especially helpful if you have IBS with constipation. ? Avoid any foods and drinks that make your symptoms worse. These may include dairy products and caffeinated or carbonated drinks. ? Do not eat large meals. ? Drink enough   fluid to keep your urine clear or pale yellow.  Exercise regularly. Ask your health care provider for recommendations of good activities for you.  Keep all follow-up visits as directed by your health care provider. This is important. Contact a health care provider if:  You have constant pain.  You have trouble or pain with swallowing.  You have worsening diarrhea. Get help right away  if:  You have severe and worsening abdominal pain.  You have diarrhea and: ? You have a rash, stiff neck, or severe headache. ? You are irritable, sleepy, or difficult to awaken. ? You are weak, dizzy, or extremely thirsty.  You have bright red blood in your stool or you have black tarry stools.  You have unusual abdominal swelling that is painful.  You vomit continuously.  You vomit blood (hematemesis).  You have both abdominal pain and a fever. This information is not intended to replace advice given to you by your health care provider. Make sure you discuss any questions you have with your health care provider. Document Released: 10/04/2005 Document Revised: 03/05/2016 Document Reviewed: 06/21/2014 Elsevier Interactive Patient Education  2018 Elsevier Inc. Diet for Irritable Bowel Syndrome When you have irritable bowel syndrome (IBS), the foods you eat and your eating habits are very important. IBS may cause various symptoms, such as abdominal pain, constipation, or diarrhea. Choosing the right foods can help ease discomfort caused by these symptoms. Work with your health care provider and dietitian to find the best eating plan to help control your symptoms. What general guidelines do I need to follow?  Keep a food diary. This will help you identify foods that cause symptoms. Write down: ? What you eat and when. ? What symptoms you have. ? When symptoms occur in relation to your meals.  Avoid foods that cause symptoms. Talk with your dietitian about other ways to get the same nutrients that are in these foods.  Eat more foods that contain fiber. Take a fiber supplement if directed by your dietitian.  Eat your meals slowly, in a relaxed setting.  Aim to eat 5-6 small meals per day. Do not skip meals.  Drink enough fluids to keep your urine clear or pale yellow.  Ask your health care provider if you should take an over-the-counter probiotic during flare-ups to help restore  healthy gut bacteria.  If you have cramping or diarrhea, try making your meals low in fat and high in carbohydrates. Examples of carbohydrates are pasta, rice, whole grain breads and cereals, fruits, and vegetables.  If dairy products cause your symptoms to flare up, try eating less of them. You might be able to handle yogurt better than other dairy products because it contains bacteria that help with digestion. What foods are not recommended? The following are some foods and drinks that may worsen your symptoms:  Fatty foods, such as French fries.  Milk products, such as cheese or ice cream.  Chocolate.  Alcohol.  Products with caffeine, such as coffee.  Carbonated drinks, such as soda.  The items listed above may not be a complete list of foods and beverages to avoid. Contact your dietitian for more information. What foods are good sources of fiber? Your health care provider or dietitian may recommend that you eat more foods that contain fiber. Fiber can help reduce constipation and other IBS symptoms. Add foods with fiber to your diet a little at a time so that your body can get used to them. Too much fiber at once   might cause gas and swelling of your abdomen. The following are some foods that are good sources of fiber:  Apples.  Peaches.  Pears.  Berries.  Figs.  Broccoli (raw).  Cabbage.  Carrots.  Raw peas.  Kidney beans.  Lima beans.  Whole grain bread.  Whole grain cereal.  Where to find more information: International Foundation for Functional Gastrointestinal Disorders: www.iffgd.org National Institute of Diabetes and Digestive and Kidney Diseases: www.niddk.nih.gov/health-information/health-topics/digestive-diseases/ibs/Pages/facts.aspx This information is not intended to replace advice given to you by your health care provider. Make sure you discuss any questions you have with your health care provider. Document Released: 12/25/2003 Document Revised:  03/11/2016 Document Reviewed: 01/04/2014 Elsevier Interactive Patient Education  2018 Elsevier Inc.  

## 2017-09-19 ENCOUNTER — Ambulatory Visit: Payer: 59 | Admitting: Family

## 2017-09-27 ENCOUNTER — Ambulatory Visit: Payer: 59 | Admitting: Physician Assistant

## 2017-10-21 ENCOUNTER — Ambulatory Visit: Payer: 59 | Admitting: Physician Assistant

## 2017-10-24 ENCOUNTER — Encounter: Payer: Self-pay | Admitting: Physician Assistant

## 2017-10-24 ENCOUNTER — Ambulatory Visit (INDEPENDENT_AMBULATORY_CARE_PROVIDER_SITE_OTHER): Payer: 59 | Admitting: Physician Assistant

## 2017-10-24 VITALS — BP 123/83 | HR 82 | Temp 97.4°F | Ht 71.0 in | Wt 211.2 lb

## 2017-10-24 DIAGNOSIS — K580 Irritable bowel syndrome with diarrhea: Secondary | ICD-10-CM | POA: Diagnosis not present

## 2017-10-24 DIAGNOSIS — L309 Dermatitis, unspecified: Secondary | ICD-10-CM

## 2017-10-24 MED ORDER — HYOSCYAMINE SULFATE 0.125 MG SL SUBL: 0.1250 mg | SUBLINGUAL_TABLET | Freq: Four times a day (QID) | SUBLINGUAL | 0 refills | Status: DC | PRN

## 2017-10-24 MED ORDER — CLOBETASOL PROPIONATE 0.05 % EX CREA: 1.0000 "application " | TOPICAL_CREAM | Freq: Two times a day (BID) | CUTANEOUS | 0 refills | Status: DC

## 2017-10-24 NOTE — Patient Instructions (Signed)
Diet for Irritable Bowel Syndrome When you have irritable bowel syndrome (IBS), the foods you eat and your eating habits are very important. IBS may cause various symptoms, such as abdominal pain, constipation, or diarrhea. Choosing the right foods can help ease discomfort caused by these symptoms. Work with your health care provider and dietitian to find the best eating plan to help control your symptoms. What general guidelines do I need to follow?  Keep a food diary. This will help you identify foods that cause symptoms. Write down: ? What you eat and when. ? What symptoms you have. ? When symptoms occur in relation to your meals.  Avoid foods that cause symptoms. Talk with your dietitian about other ways to get the same nutrients that are in these foods.  Eat more foods that contain fiber. Take a fiber supplement if directed by your dietitian.  Eat your meals slowly, in a relaxed setting.  Aim to eat 5-6 small meals per day. Do not skip meals.  Drink enough fluids to keep your urine clear or pale yellow.  Ask your health care provider if you should take an over-the-counter probiotic during flare-ups to help restore healthy gut bacteria.  If you have cramping or diarrhea, try making your meals low in fat and high in carbohydrates. Examples of carbohydrates are pasta, rice, whole grain breads and cereals, fruits, and vegetables.  If dairy products cause your symptoms to flare up, try eating less of them. You might be able to handle yogurt better than other dairy products because it contains bacteria that help with digestion. What foods are not recommended? The following are some foods and drinks that may worsen your symptoms:  Fatty foods, such as French fries.  Milk products, such as cheese or ice cream.  Chocolate.  Alcohol.  Products with caffeine, such as coffee.  Carbonated drinks, such as soda.  The items listed above may not be a complete list of foods and beverages to  avoid. Contact your dietitian for more information. What foods are good sources of fiber? Your health care provider or dietitian may recommend that you eat more foods that contain fiber. Fiber can help reduce constipation and other IBS symptoms. Add foods with fiber to your diet a little at a time so that your body can get used to them. Too much fiber at once might cause gas and swelling of your abdomen. The following are some foods that are good sources of fiber:  Apples.  Peaches.  Pears.  Berries.  Figs.  Broccoli (raw).  Cabbage.  Carrots.  Raw peas.  Kidney beans.  Lima beans.  Whole grain bread.  Whole grain cereal.  Where to find more information: International Foundation for Functional Gastrointestinal Disorders: www.iffgd.org National Institute of Diabetes and Digestive and Kidney Diseases: www.niddk.nih.gov/health-information/health-topics/digestive-diseases/ibs/Pages/facts.aspx This information is not intended to replace advice given to you by your health care provider. Make sure you discuss any questions you have with your health care provider. Document Released: 12/25/2003 Document Revised: 03/11/2016 Document Reviewed: 01/04/2014 Elsevier Interactive Patient Education  2018 Elsevier Inc.  

## 2017-10-24 NOTE — Progress Notes (Signed)
BP 123/83   Pulse 82   Temp (!) 97.4 F (36.3 C) (Oral)   Ht 5\' 11"  (1.803 m)   Wt 211 lb 3.2 oz (95.8 kg)   BMI 29.46 kg/m    Subjective:    Patient ID: Jeffrey Ortega, male    DOB: 06-02-1991, 27 y.o.   MRN: [id number]  HPI: SIDHANT HELDERMAN is a 27 y.o. male presenting on 10/24/2017 for Diarrhea  Follow-up diarrhea.  He will have several episodes in a day.  He never suffers from constipation.  He cannot tell what medications or food causes this to happen.  He also has a rash on his legs.  It is primarily where the letter of his boot touches his leg.  He does not have a rash under his sock.  This is Patient comes in for recheck of his abdominal pain.  He was thought to have some GERD in the past.  However he never started the omeprazole.  He is not having any upper symptoms.  He is primarily having cramping and Relevant past medical, surgical, family and social history reviewed and updated as indicated. Allergies and medications reviewed and updated.  Past Medical History:  Diagnosis Date  . Allergy   . 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 Meningitis   . Pericarditis, acute     Past Surgical History:  Procedure Laterality Date  . TONSILLECTOMY      Review of Systems  Constitutional: Negative.  Negative for appetite change and fatigue.  HENT: Negative.   Eyes: Negative.  Negative for pain and visual disturbance.  Respiratory: Negative.  Negative for cough, chest tightness, shortness of breath and wheezing.   Cardiovascular: Negative.  Negative for chest pain, palpitations and leg swelling.  Gastrointestinal: Positive for abdominal distention, abdominal pain and diarrhea. Negative for nausea and vomiting.  Endocrine: Negative.   Genitourinary: Negative.   Musculoskeletal: Negative.   Skin: Positive for rash. Negative for color change.  Neurological: Negative.  Negative for weakness, numbness and headaches.  Psychiatric/Behavioral: Negative.     Allergies as  of 10/24/2017      Reactions   Keflex [cephalexin]       Medication List        Accurate as of 10/24/17 10:46 PM. Always use your most recent med list.          clobetasol cream 0.05 % Commonly known as:  TEMOVATE Apply 1 application topically 2 (two) times daily.   hyoscyamine 0.125 MG SL tablet Commonly known as:  LEVSIN SL Place 1 tablet (0.125 mg total) under the tongue every 6 (six) hours as needed for cramping.          Objective:    BP 123/83   Pulse 82   Temp (!) 97.4 F (36.3 C) (Oral)   Ht 5\' 11"  (1.803 m)   Wt 211 lb 3.2 oz (95.8 kg)   BMI 29.46 kg/m   Allergies  Allergen Reactions  . Keflex [Cephalexin]     Physical Exam  Constitutional: He appears well-developed and well-nourished.  HENT:  Head: Normocephalic and atraumatic.  Eyes: Conjunctivae and EOM are normal. Pupils are equal, round, and reactive to light.  Neck: Normal range of motion. Neck supple.  Cardiovascular: Normal rate, regular rhythm and normal heart sounds.  Pulmonary/Chest: Effort normal and breath sounds normal.  Abdominal: Soft. Bowel sounds are normal.  Musculoskeletal: Normal range of motion.  Skin: Skin is warm and  dry. Rash noted. Rash is urticarial. There is erythema.           Assessment & Plan:   1. Irritable bowel syndrome with diarrhea - hyoscyamine (LEVSIN SL) 0.125 MG SL tablet; Place 1 tablet (0.125 mg total) under the tongue every 6 (six) hours as needed for cramping.  Dispense: 90 tablet; Refill: 0  2. Eczema, unspecified type - clobetasol cream (TEMOVATE) 0.05 %; Apply 1 application topically 2 (two) times daily.  Dispense: 30 g; Refill: 0    Current Outpatient Medications:  .  clobetasol cream (TEMOVATE) 0.05 %, Apply 1 application topically 2 (two) times daily., Disp: 30 g, Rfl: 0 .  hyoscyamine (LEVSIN SL) 0.125 MG SL tablet, Place 1 tablet (0.125 mg total) under the tongue every 6 (six) hours as needed for cramping., Disp: 90 tablet, Rfl: 0 Continue  all other maintenance medications as listed above.  Follow up plan: Return in about 4 weeks (around 11/21/2017) for recheck.  Educational handout given for IBS  Remus LofflerAngel S. Tylor Gambrill PA-C [facility] [address]  [place], [place] [id number] [phone]   10/24/2017, 10:46 PM

## 2017-11-01 ENCOUNTER — Other Ambulatory Visit: Payer: Self-pay | Admitting: Physician Assistant

## 2017-11-01 MED ORDER — HYOSCYAMINE SULFATE 0.125 MG PO TABS: 0.1250 mg | ORAL_TABLET | ORAL | 2 refills | Status: DC | PRN

## 2017-11-23 ENCOUNTER — Ambulatory Visit: Payer: 59 | Admitting: Physician Assistant

## 2018-10-01 IMAGING — DX DG CHEST 2V
2 series · 2 of 2 positions shown · non-contrast
Comparison: 07/25/2016

CLINICAL DATA: Left side chest pain

EXAM:
CHEST  2 VIEW

[chest pa]
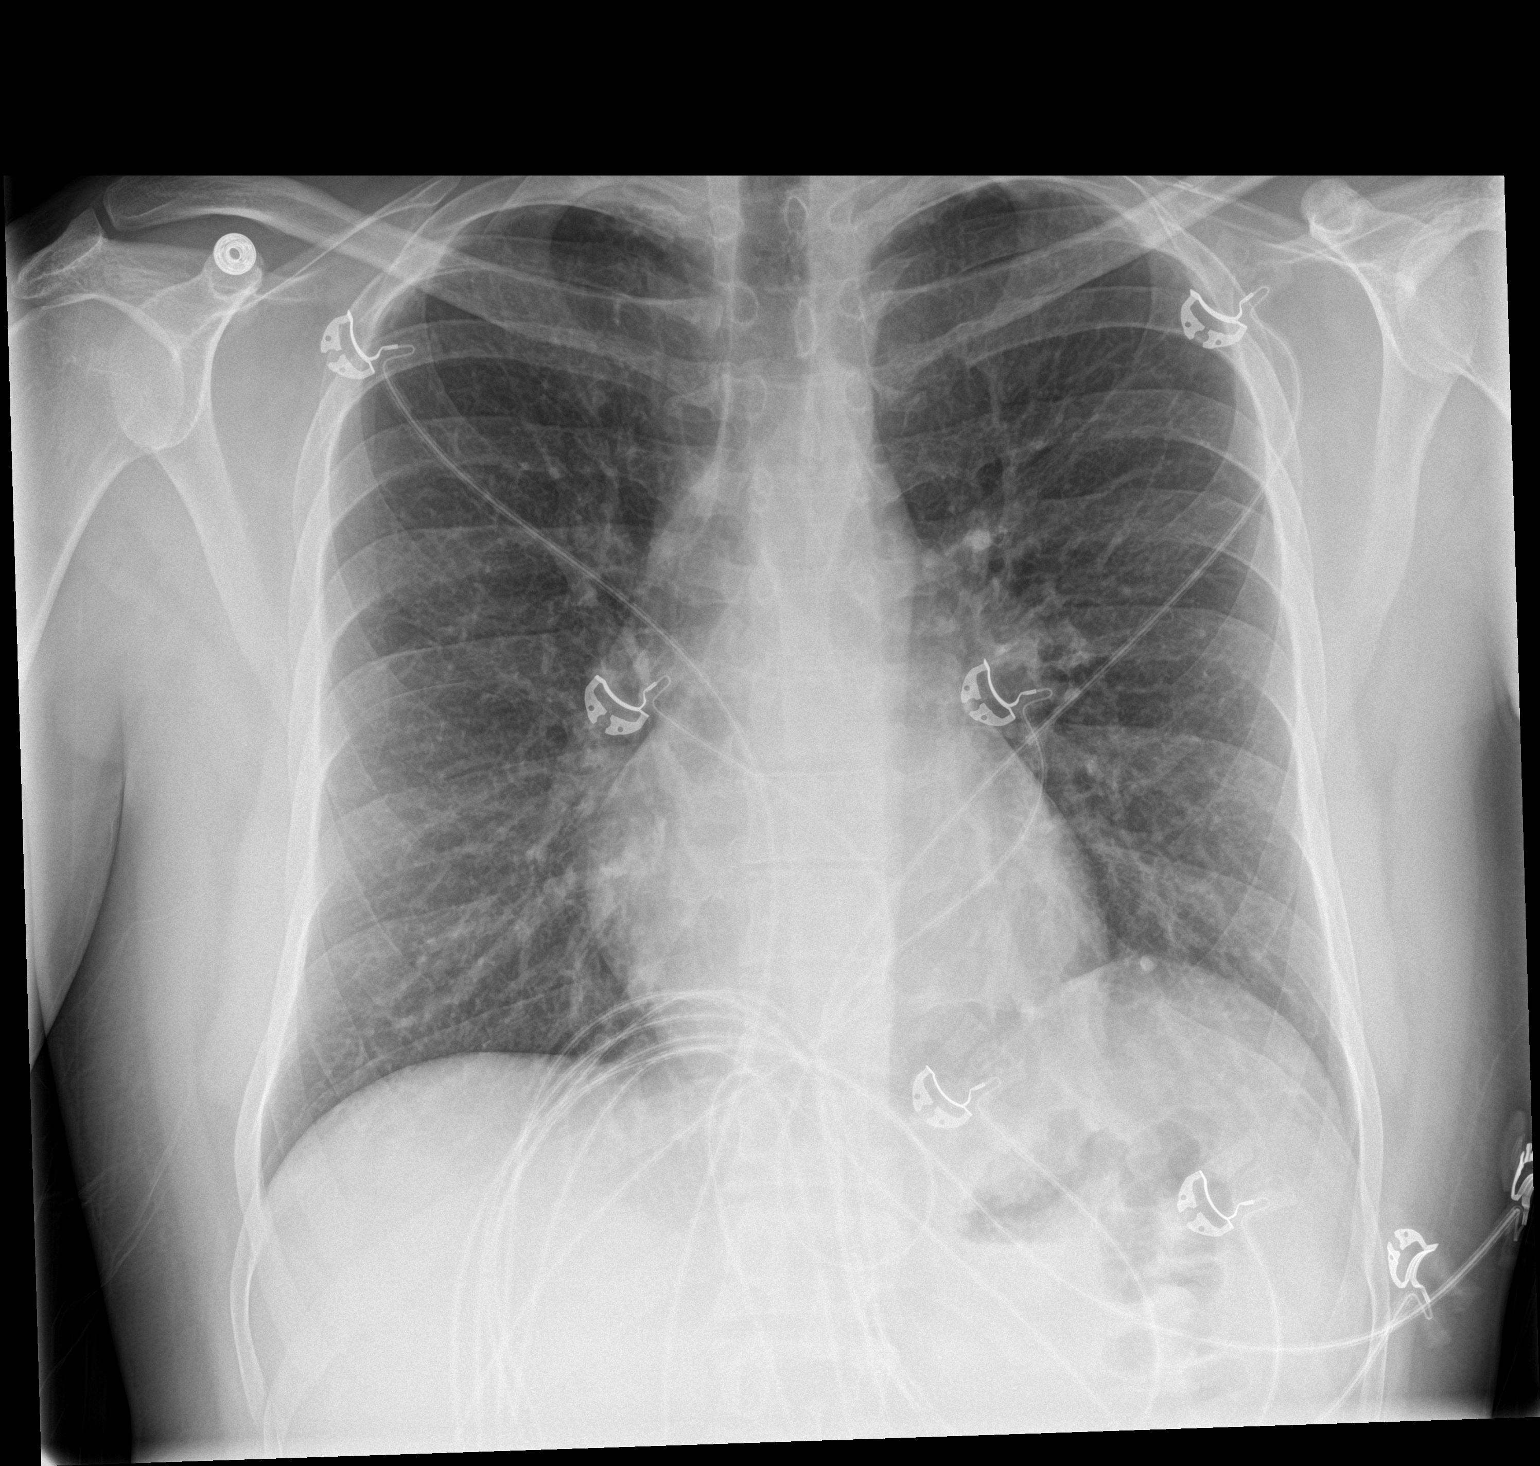

[chest lat]
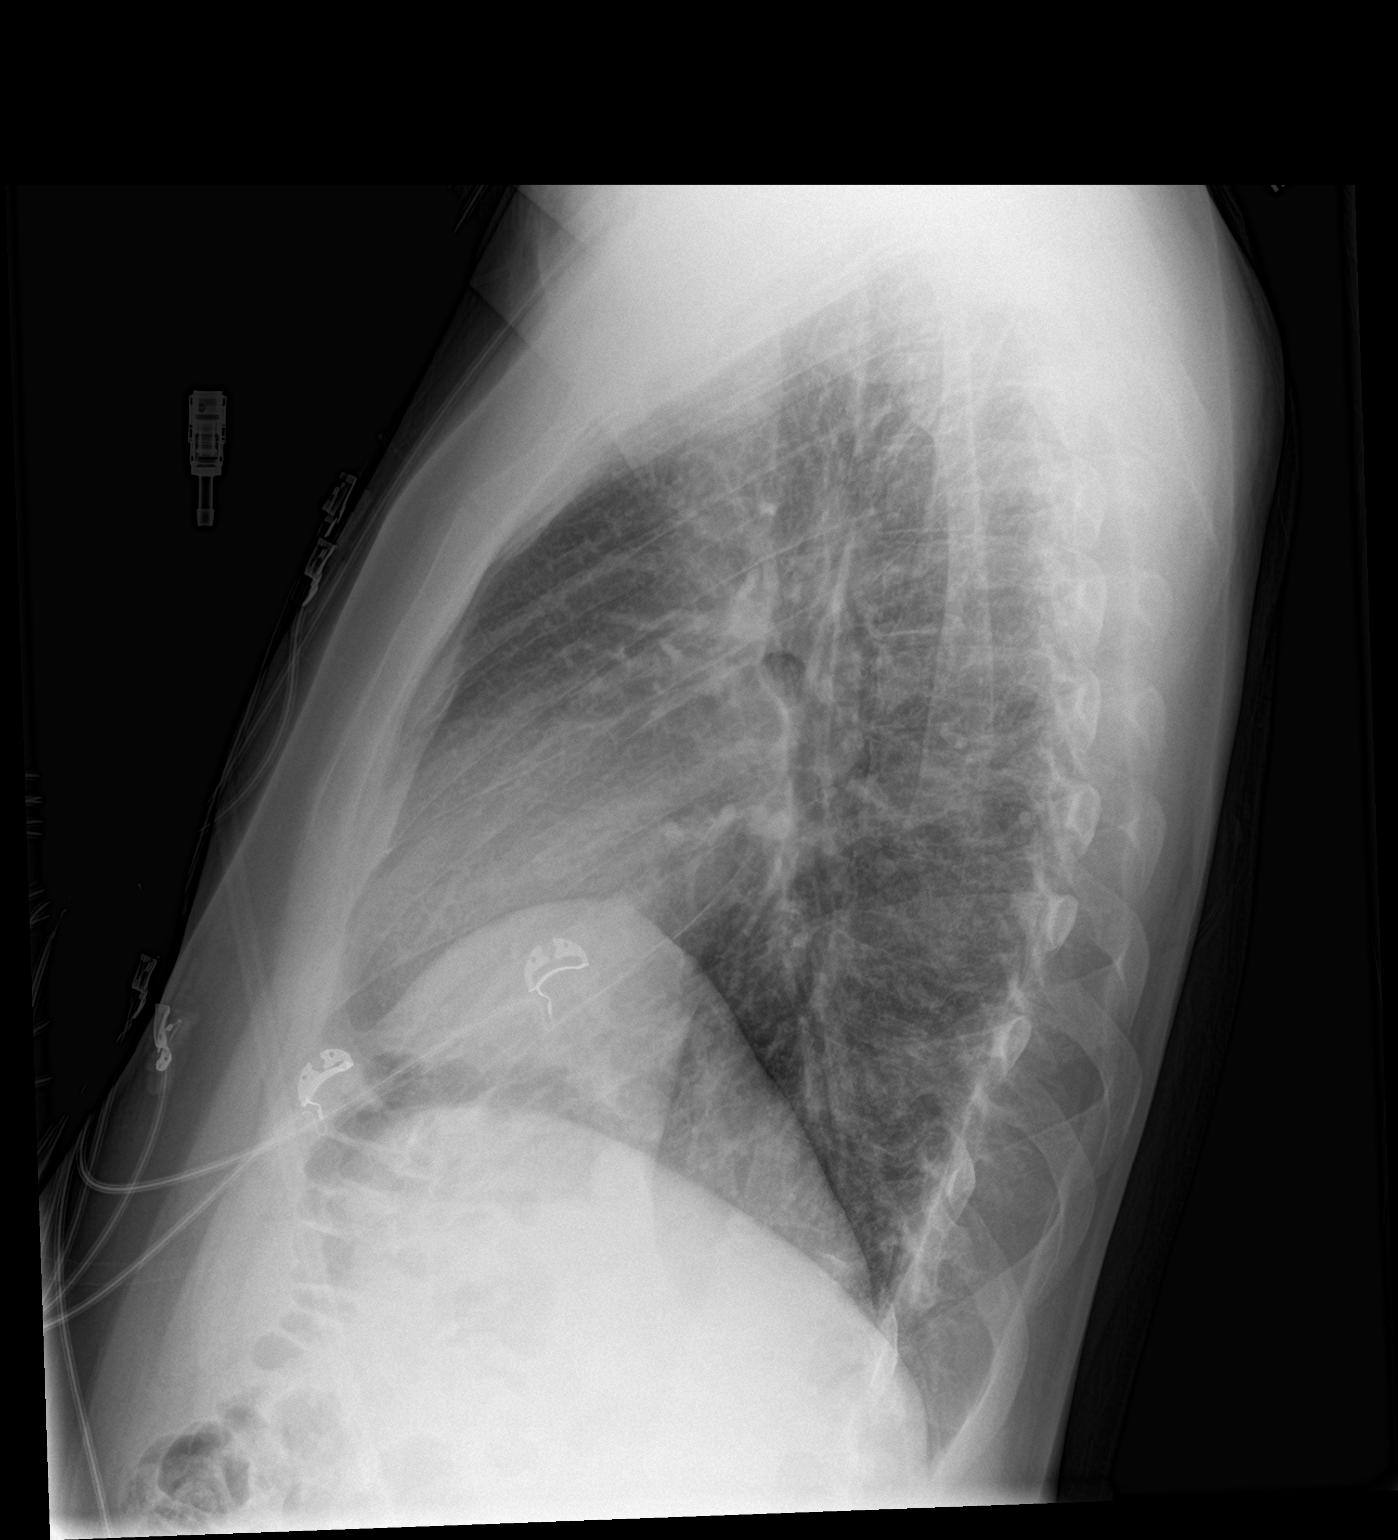

[2 of 2 positions shown; findings below may reference images not displayed]

FINDINGS: Cardiomediastinal silhouette is stable. Mild elevation of the left
hemidiaphragm again noted. No infiltrate or pulmonary edema. Bony
thorax is unremarkable.
IMPRESSION: No active cardiopulmonary disease.

## 2018-11-09 ENCOUNTER — Telehealth: Payer: Self-pay | Admitting: Cardiovascular Disease

## 2018-11-09 NOTE — Telephone Encounter (Signed)
Numerous attempts to contact patient with recall letters. Unable to reach by telephone. with no success.   User: Time: Status:    Jeffrey Ortega [[id number]] 01/13/2017 4:10 PM New [10]   [System] 09/17/2017 11:03 PM Notification Sent [20]   Jeffrey Ortega [[id number]] 10/03/2018 11:16 AM Notification Sent [20]   Jeffrey Ortega [[id number]] 11/09/2018 1:04 PM Notification Sent [20]

## 2022-11-23 ENCOUNTER — Encounter: Payer: Self-pay | Admitting: *Deleted

## 2023-04-26 ENCOUNTER — Ambulatory Visit: Payer: PRIVATE HEALTH INSURANCE | Admitting: Internal Medicine

## 2023-04-29 ENCOUNTER — Ambulatory Visit: Payer: PRIVATE HEALTH INSURANCE | Attending: Internal Medicine | Admitting: Internal Medicine

## 2023-04-29 ENCOUNTER — Encounter: Payer: Self-pay | Admitting: Internal Medicine

## 2023-04-29 VITALS — BP 112/82 | HR 93 | Ht 71.0 in | Wt 228.0 lb

## 2023-04-29 DIAGNOSIS — Z8679 Personal history of other diseases of the circulatory system: Secondary | ICD-10-CM

## 2023-04-29 DIAGNOSIS — Z01812 Encounter for preprocedural laboratory examination: Secondary | ICD-10-CM

## 2023-04-29 DIAGNOSIS — I10 Essential (primary) hypertension: Secondary | ICD-10-CM

## 2023-04-29 DIAGNOSIS — I301 Infective pericarditis: Secondary | ICD-10-CM | POA: Diagnosis not present

## 2023-04-29 NOTE — Progress Notes (Signed)
Cardiology Office Note  Date: 04/29/2023   [name], DOB 11-Mar-1991, MRN [id number]  PCP:  Jeffrey Daniel, PA-C  Cardiologist:  None Electrophysiologist:  None   Reason for Office Visit: Evaluation chest pain   History of Present Illness: Jeffrey Ortega is a 32 y.o. male with a history of focal pericarditis in 2017 was referred to cardiology clinic for evaluation chest pain.   Patient was diagnosed with mild acute focal pericarditis in 2017 through cardiac MRI and was placed on steroids to be taken during acute flares. NSAIDs did not elevate his symptoms and colchicine was too expensive for him.  He does not remember if he took steroids at that time or not.  He continues to have similar chest pains, occurs few times per week, last for a few minutes, occurs at both rest and exertion, it similar to chest tightness occurring on the left side of his chest.  No DOE, orthopnea, PND.  He lifts heavy weights at work and sometimes gets chest tightness.  No dizziness, syncope, palpitations.  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     No current facility-administered medications for this visit.   Allergies:  Keflex [cephalexin]   Social History: The patient  reports that he has never smoked. He has never used smokeless tobacco. He reports that he does not drink alcohol and does not use drugs.   Family History: The patient's family history includes CVA in his father; Heart attack in his paternal grandfather; Heart disease in his paternal grandfather and paternal uncle.   ROS:  Please see the history of present  illness. Otherwise, complete review of systems is positive for none  All other systems are reviewed and negative.   Physical Exam: VS:  BP 112/82 (BP Location: Left Arm, Patient Position: Sitting, Cuff Size: Normal)   Pulse 93   Ht 5\' 11"  (1.803 m)   Wt 228 lb (103.4 kg)   SpO2 96%   BMI 31.80 kg/m , BMI Body mass index is 31.8 kg/m.  Wt Readings from Last 3 Encounters:  04/29/23 228 lb (103.4 kg)  10/24/17 211 lb 3.2 oz (95.8 kg)  08/16/17 207 lb 9.6 oz (94.2 kg)    General: Patient appears comfortable at rest. HEENT: Conjunctiva and lids normal, oropharynx clear with moist mucosa. Neck: Supple, no elevated JVP or carotid bruits, no thyromegaly. Lungs: Clear to auscultation, nonlabored breathing at rest. Cardiac: Regular rate and rhythm, no S3 or significant systolic murmur, no pericardial rub. Abdomen: Soft, nontender, no hepatomegaly, bowel sounds present, no guarding or rebound. Extremities: No pitting edema, distal pulses 2+. Skin: Warm and dry. Musculoskeletal: No kyphosis. Neuropsychiatric: Alert and oriented x3, affect grossly appropriate.  Recent Labwork: No results found for requested labs within last 365 days.  No results found for: "CHOL", "TRIG", "HDL", "CHOLHDL", "VLDL", "LDLCALC", "LDLDIRECT"   Assessment and Plan:  # Chest pain, rule out pericarditis -Cardiac MRI from 2017 showed mild focal acute pericarditis with no evidence of pericardial effusion. NSAIDs did not alleviate  his symptoms. Colchicine was too expensive. He was prescribed steroids in 2018 and was lost to follow-up.  Patient continues to have similar episodes of chest pain, few times per week, at rest and exertion, worsens with supine position sometimes.  Last for a few minutes.  Obtain ESR and CRP.  Repeat cardiac MRI.   I have spent a total of 30 minutes with patient reviewing chart, EKGs, labs and examining patient as well as establishing an assessment and plan that was discussed with the  patient.  > 50% of time was spent in direct patient care.    Medication Adjustments/Labs and Tests Ordered: Current medicines are reviewed at length with the patient today.  Concerns regarding medicines are outlined above.   Tests Ordered: Orders Placed This Encounter  Procedures   MR CARDIAC MORPHOLOGY W WO CONTRAST   Sed Rate (ESR)   C-reactive protein   CBC   EKG 12-Lead    Medication Changes: No orders of the defined types were placed in this encounter.   Disposition:  Follow up  pending results  Signed Landry Kamath Verne Spurr, MD, 04/29/2023 3:43 PM    [facility] at [facility] [address] [place], [place], [place] [address]

## 2023-04-29 NOTE — Patient Instructions (Addendum)
Medication Instructions:  Your physician recommends that you continue on your current medications as directed. Please refer to the Current Medication list given to you today.   Labwork: ESR and CRP to be done at [facility]  Testing/Procedures:   You are scheduled for Cardiac MRI on ______________. Please arrive for your appointment at ______________ ( arrive 30-45 minutes prior to test start time). ?  [facility] [address] [place], [place] [address] [phone] Please take advantage of the free valet parking available at the [facility] and [employer] (Entrance C).  Proceed to the [facility] Radiology Department (First Floor) for check-in.   Magnetic resonance imaging (MRI) is a painless test that produces images of the inside of the body without using Xrays.  During an MRI, strong magnets and radio waves work together in a Data processing manager to form detailed images.   MRI images may provide more details about a medical condition than X-rays, CT scans, and ultrasounds can provide.  You may be given earphones to listen for instructions.  You may eat a light breakfast and take medications as ordered with the exception of furosemide, hydrochlorothiazide, or spironolactone(fluid pill, other). Please avoid stimulants for 12 hr prior to test. (Ie. Caffeine, nicotine, chocolate, or antihistamine medications)  If a contrast material will be used, an IV will be inserted into one of your veins. Contrast material will be injected into your IV. It will leave your body through your urine within a day. You may be told to drink plenty of fluids to help flush the contrast material out of your system.  You will be asked to remove all metal, including: Watch, jewelry, and other metal objects including hearing aids, hair pieces and dentures. Also wearable glucose monitoring systems (ie. Freestyle Libre and Omnipods) (Braces and fillings normally are not a problem.)   TEST WILL TAKE  APPROXIMATELY 1 HOUR  PLEASE NOTIFY SCHEDULING AT LEAST 24 HOURS IN ADVANCE IF YOU ARE UNABLE TO KEEP YOUR APPOINTMENT. [phone]  For more information and frequently asked questions, please visit our website [email or web address]  Please call Rockwell Alexandria, cardiac imaging nurse navigator with any questions/concerns. Rockwell Alexandria RN Navigator Cardiac Imaging Larey Brick RN Navigator Cardiac Imaging [facility] [phone] Office    Follow-Up: Your physician recommends that you schedule a follow-up appointment in: Pending Results  Any Other Special Instructions Will Be Listed Below (If Applicable).  If you need a refill on your cardiac medications before your next appointment, please call your pharmacy.

## 2023-05-25 ENCOUNTER — Encounter: Payer: Self-pay | Admitting: Internal Medicine

## 2023-07-12 ENCOUNTER — Other Ambulatory Visit (HOSPITAL_COMMUNITY): Payer: PRIVATE HEALTH INSURANCE

## 2023-07-18 ENCOUNTER — Encounter: Payer: Self-pay | Admitting: Internal Medicine

## 2023-07-20 ENCOUNTER — Encounter (HOSPITAL_COMMUNITY): Payer: Self-pay

## 2023-07-22 ENCOUNTER — Ambulatory Visit (HOSPITAL_COMMUNITY): Admission: RE | Admit: 2023-07-22 | Payer: PRIVATE HEALTH INSURANCE | Source: Ambulatory Visit

## 2023-08-31 ENCOUNTER — Encounter (HOSPITAL_COMMUNITY): Payer: Self-pay

## 2023-09-01 ENCOUNTER — Telehealth (HOSPITAL_COMMUNITY): Payer: Self-pay | Admitting: *Deleted

## 2023-09-01 NOTE — Telephone Encounter (Signed)
Patient calling to reschedule his cardiac MRI to next year due to insurance purposes.  Informed him that I will cancel his appt for September 01, 2023 and someone will reach back out for a new date.  Larey Brick RN Navigator Cardiac Imaging [facility] [phone] Office [phone] Cell

## 2023-09-02 ENCOUNTER — Ambulatory Visit (HOSPITAL_COMMUNITY): Admission: RE | Admit: 2023-09-02 | Payer: PRIVATE HEALTH INSURANCE | Source: Ambulatory Visit

## 2023-10-28 ENCOUNTER — Encounter: Payer: Self-pay | Admitting: Internal Medicine

## 2023-10-28 DIAGNOSIS — Z8679 Personal history of other diseases of the circulatory system: Secondary | ICD-10-CM

## 2023-10-28 DIAGNOSIS — Z01812 Encounter for preprocedural laboratory examination: Secondary | ICD-10-CM

## 2023-10-28 NOTE — Telephone Encounter (Signed)
 Spoke with the patient over the phone. Released lab work orders so the patient can get them completed at [facility] before his mri is scheduled. He verbalized understanding

## 2023-11-11 ENCOUNTER — Other Ambulatory Visit: Payer: Self-pay

## 2023-11-11 DIAGNOSIS — Z01812 Encounter for preprocedural laboratory examination: Secondary | ICD-10-CM

## 2023-11-11 NOTE — Telephone Encounter (Signed)
Patient is following up. He states he has spoken with [facility] and they do not have the orders. He would like to know if the orders can be faxed directly to him at [phone]. He would like a call back to confirm.

## 2023-11-12 LAB — CBC
Hematocrit: 47.3 % (ref 37.5–51.0)
Hemoglobin: 15.7 g/dL (ref 13.0–17.7)
MCH: 29.6 pg (ref 26.6–33.0)
MCHC: 33.2 g/dL (ref 31.5–35.7)
MCV: 89 fL (ref 79–97)
Platelets: 332 10*3/uL (ref 150–450)
RBC: 5.31 x10E6/uL (ref 4.14–5.80)
RDW: 13 % (ref 11.6–15.4)
WBC: 7.6 10*3/uL (ref 3.4–10.8)

## 2023-11-12 LAB — C-REACTIVE PROTEIN: CRP: 6 mg/L (ref 0–10)

## 2023-11-12 LAB — SEDIMENTATION RATE: Sed Rate: 16 mm/h — ABNORMAL HIGH (ref 0–15)

## 2023-11-16 ENCOUNTER — Encounter (HOSPITAL_COMMUNITY): Payer: Self-pay

## 2023-11-17 MED ORDER — OMEPRAZOLE 20 MG PO CPDR: 20.0000 mg | DELAYED_RELEASE_CAPSULE | Freq: Two times a day (BID) | ORAL | 0 refills | Status: DC

## 2023-11-17 MED ORDER — IBUPROFEN 800 MG PO TABS: ORAL_TABLET | ORAL | 0 refills | Status: AC

## 2023-11-17 MED ORDER — COLCHICINE 0.6 MG PO TABS: 0.6000 mg | ORAL_TABLET | Freq: Two times a day (BID) | ORAL | 3 refills | Status: AC

## 2023-11-17 NOTE — Addendum Note (Signed)
Addended by: Kerney Elbe on: 11/17/2023 04:03 PM   Modules accepted: Orders

## 2023-11-17 NOTE — Telephone Encounter (Signed)
-----   Message from Vishnu P Mallipeddi sent at 11/14/2023  9:28 AM EST ----- ESR is mildly elevated. He has chest pains, had pericarditis in the past. He tried NSAIDS in the past which did not provide any relief. We need to try again if patient is open to this. Start Ibuprofen 800 mg TID x 2 weeks followed by tapering regimen (800 mg BID x 1 week followed by 800 mg once daily and stop), omeprazole 20 mg BID x 1 month and colchicine 0.6 mg BID x 3 months.

## 2023-11-18 ENCOUNTER — Other Ambulatory Visit: Payer: Self-pay | Admitting: Internal Medicine

## 2023-11-18 ENCOUNTER — Ambulatory Visit (HOSPITAL_COMMUNITY)
Admission: RE | Admit: 2023-11-18 | Discharge: 2023-11-18 | Disposition: A | Discharge status: Home / Self Care | Payer: No Typology Code available for payment source | Source: Ambulatory Visit | Attending: Internal Medicine | Admitting: Internal Medicine

## 2023-11-18 DIAGNOSIS — I3139 Other pericardial effusion (noninflammatory): Secondary | ICD-10-CM | POA: Diagnosis not present

## 2023-11-18 DIAGNOSIS — Z8679 Personal history of other diseases of the circulatory system: Secondary | ICD-10-CM

## 2023-11-18 MED ORDER — GADOBUTROL 1 MMOL/ML IV SOLN
10.0000 mL | Freq: Once | INTRAVENOUS | Status: AC | PRN
  Administered 2023-11-18: 10 mL via INTRAVENOUS

## 2024-01-05 ENCOUNTER — Ambulatory Visit: Payer: No Typology Code available for payment source | Attending: Internal Medicine | Admitting: Internal Medicine

## 2024-01-05 ENCOUNTER — Encounter: Payer: Self-pay | Admitting: Internal Medicine

## 2024-01-05 VITALS — BP 104/82 | HR 88 | Ht 71.0 in | Wt 234.0 lb

## 2024-01-05 DIAGNOSIS — Z01810 Encounter for preprocedural cardiovascular examination: Secondary | ICD-10-CM | POA: Diagnosis not present

## 2024-01-05 DIAGNOSIS — R079 Chest pain, unspecified: Secondary | ICD-10-CM

## 2024-01-05 DIAGNOSIS — Z8679 Personal history of other diseases of the circulatory system: Secondary | ICD-10-CM

## 2024-01-05 MED ORDER — METOPROLOL TARTRATE 100 MG PO TABS: 100.0000 mg | ORAL_TABLET | Freq: Once | ORAL | 0 refills | Status: AC

## 2024-01-05 NOTE — Patient Instructions (Addendum)
 Medication Instructions:  Your physician recommends that you continue on your current medications as directed. Please refer to the Current Medication list given to you today.   Labwork: BMET to be completed at [facility]  Testing/Procedures: Your physician has requested that you have an echocardiogram. Echocardiography is a painless test that uses sound waves to create images of your heart. It provides your doctor with information about the size and shape of your heart and how well your heart's chambers and valves are working. This procedure takes approximately one hour. There are no restrictions for this procedure. Please do NOT wear cologne, perfume, aftershave, or lotions (deodorant is allowed). Please arrive 15 minutes prior to your appointment time.  Please note: We ask at that you not bring children with you during ultrasound (echo/ vascular) testing. Due to room size and safety concerns, children are not allowed in the ultrasound rooms during exams. Our front office staff cannot provide observation of children in our lobby area while testing is being conducted. An adult accompanying a patient to their appointment will only be allowed in the ultrasound room at the discretion of the ultrasound technician under special circumstances. We apologize for any inconvenience.    Your cardiac CT will be scheduled at one of the below locations:   [facility] [address] [place], [place] [address] [phone]  If scheduled at [facility], please arrive at the [facility] Entrance (Entrance C2) of [facility] 30 minutes prior to test start time. You can use the FREE valet parking offered at entrance C (encouraged to control the heart rate for the test)  Proceed to the [facility] Radiology Department (first floor) to check-in and test prep.  All radiology patients and guests should use entrance C2 at [facility], accessed from [facility],  even though the hospital's physical address listed is [address]     Please follow these instructions carefully (unless otherwise directed):  An IV will be required for this test and Nitroglycerin will be given.   On the Night Before the Test: Be sure to Drink plenty of water. Do not consume any caffeinated/decaffeinated beverages or chocolate 12 hours prior to your test. Do not take any antihistamines 12 hours prior to your test. If the patient has contrast allergy: No allergy  On the Day of the Test: Drink plenty of water until 1 hour prior to the test. Do not eat any food 1 hour prior to test. You may take your regular medications prior to the test.  Take metoprolol 100 mg(Lopressor) two hours prior to test. Patients who wear a continuous glucose monitor MUST remove the device prior to scanning.      After the Test: Drink plenty of water. After receiving IV contrast, you may experience a mild flushed feeling. This is normal. On occasion, you may experience a mild rash up to 24 hours after the test. This is not dangerous. If this occurs, you can take Benadryl 25 mg, Zyrtec, Claritin, or Allegra and increase your fluid intake. (Patients taking Tikosyn should avoid Benadryl, and may take Zyrtec, Claritin, or Allegra) If you experience trouble breathing, this can be serious. If it is severe call 911 IMMEDIATELY. If it is mild, please call our office.  We will call to schedule your test 2-4 weeks out understanding that some insurance companies will need an authorization prior to the service being performed.   For more information and frequently asked questions, please visit  our website : [email or web address]  For non-scheduling related questions, please contact the cardiac imaging nurse navigator should you have any questions/concerns: Cardiac Imaging Nurse Navigators Direct Office Dial: [phone]   For scheduling needs, including cancellations and  rescheduling, please call [place], [phone].   Follow-Up: Your physician recommends that you schedule a follow-up appointment in: Pending Results  Any Other Special Instructions Will Be Listed Below (If Applicable). Thank you for choosing [facility]!     If you need a refill on your cardiac medications before your next appointment, please call your pharmacy.

## 2024-01-05 NOTE — Progress Notes (Signed)
 Cardiology Office Note  Date: 01/05/2024   ID: Jeffrey Ortega, DOB September 21, 1991, MRN [id number]  PCP:  Ladora Daniel, PA-C  Cardiologist:  Marjo Bicker, MD Electrophysiologist:  None    History of Present Illness: Jeffrey Ortega is a 33 y.o. male with a history of focal pericarditis in 2017 is here for follow-up visit.  Patient was diagnosed with focal pericarditis on cardiac MRI in 2017. NSAIDs and colchicine did not resolve his symptoms at that time and hence he was prescribed steroids to be taken on an as-needed basis.  Repeat cardiac MRI in 2025 showed moderate pericardial effusion along the base of the left ventricle but no evidence of hyperenhancement, edema, pericardial thickening.  ESR was elevated and CRP was normal in 2025, hence he was started on ibuprofen, omeprazole and colchicine regimen.  His chest pain significantly improved with this but did not resolve completely.  He still continues to have pain in the left side of his chest, feels like the muscle is squeezing symptoms last between 1 second and sometimes 20 minutes.  No relation with rest or exercise.  No other symptoms of DOE, dizziness, syncope, palpitations and leg swelling.  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     colchicine 0.6 MG tablet Take 1 tablet (0.6 mg total) by mouth 2 (two) times daily. 60 tablet 3   [START ON 01/06/2024] metoprolol tartrate (LOPRESSOR) 100 MG tablet Take 1 tablet (100 mg total) by mouth once for 1 dose. 1 tablet 0   No current facility-administered medications for this visit.   Allergies:  Keflex  [cephalexin]   Social History: The patient  reports that he has never smoked. He has never used smokeless tobacco. He reports that he does not drink alcohol and does not use drugs.   Family History: The patient's family history includes CVA in his father; Heart attack in his paternal grandfather; Heart disease in his paternal grandfather and paternal uncle.   ROS:  Please see the history of present illness. Otherwise, complete review of systems is positive for none  All other systems are reviewed and negative.   Physical Exam: VS:  BP 104/82   Pulse 88   Ht 5\' 11"  (1.803 m)   Wt 234 lb (106.1 kg)   SpO2 97%   BMI 32.64 kg/m , BMI Body mass index is 32.64 kg/m.  Wt Readings from Last 3 Encounters:  01/05/24 234 lb (106.1 kg)  04/29/23 228 lb (103.4 kg)  10/24/17 211 lb 3.2 oz (95.8 kg)    General: Patient appears comfortable at rest. HEENT: Conjunctiva and lids normal, oropharynx clear with moist mucosa. Neck: Supple, no elevated JVP or carotid bruits, no thyromegaly. Lungs: Clear to auscultation, nonlabored breathing at rest. Cardiac: Regular rate and rhythm, no S3 or significant systolic murmur, no pericardial rub. Abdomen: Soft, nontender, no hepatomegaly, bowel sounds present, no guarding or rebound. Extremities: No pitting edema, distal pulses 2+. Skin: Warm and dry. Musculoskeletal: No kyphosis. Neuropsychiatric: Alert and oriented x3, affect grossly appropriate.  Recent Labwork: 11/11/2023: Hemoglobin 15.7; Platelets 332  No results found for: "CHOL", "TRIG", "HDL", "CHOLHDL", "VLDL", "LDLCALC", "LDLDIRECT"   Assessment and Plan:  Chest pain -Chest pain significantly improved after starting ibuprofen and colchicine regimen but continues to have pain in the left side of his chest (feels like his muscle is squeezing in that location). Cardiac MRI in 2017 showed mild focal acute pericarditis with no evidence of pericardial effusion.  Repeat cardiac MRI in 2025 showed  moderate pericardial effusion at the base of the left ventricle but no hyperenhancement, edema or pericardial thickening noted.  I believe his chest pains are inflammatory but not necessarily cardiac in origin.  He is nervous/anxious/frustrated with this chest pain is coming from. Will obtain echocardiogram and CT cardiac.  Medication Adjustments/Labs and Tests Ordered: Current medicines are reviewed at length with the patient today.  Concerns regarding medicines are outlined above.   Tests Ordered: Orders Placed This Encounter  Procedures   CT CORONARY MORPH W/CTA COR W/SCORE W/CA W/CM &/OR WO/CM   Basic metabolic panel   ECHOCARDIOGRAM COMPLETE    Medication Changes: Meds ordered this encounter  Medications   metoprolol tartrate (LOPRESSOR) 100 MG tablet    Sig: Take 1 tablet (100 mg total) by mouth once for 1 dose.    Dispense:  1 tablet    Refill:  0    01/05/2024-New    Disposition:  Follow up pending results  Signed Mariachristina Holle Verne Spurr, MD, 01/05/2024 10:49 AM    [facility] at [facility] [address] [place], [place], [place] [address]

## 2024-01-20 ENCOUNTER — Other Ambulatory Visit (HOSPITAL_COMMUNITY)

## 2024-01-20 ENCOUNTER — Ambulatory Visit (HOSPITAL_COMMUNITY)

## 2024-01-27 ENCOUNTER — Other Ambulatory Visit (HOSPITAL_COMMUNITY)

## 2024-02-03 ENCOUNTER — Ambulatory Visit (HOSPITAL_COMMUNITY)

## 2024-02-10 ENCOUNTER — Ambulatory Visit (HOSPITAL_COMMUNITY)

## 2024-02-10 ENCOUNTER — Other Ambulatory Visit (HOSPITAL_COMMUNITY)
